# Patient Record
Sex: Female | Born: 1985 | Race: White | Hispanic: No | Marital: Married | Smoking: Never smoker
Health system: Southern US, Community
[De-identification: ages and names within clinical notes are randomized; demographics above are authoritative.]

## PROBLEM LIST (undated history)

## (undated) DIAGNOSIS — T7840XA Allergy, unspecified, initial encounter: Secondary | ICD-10-CM

## (undated) DIAGNOSIS — N94819 Vulvodynia, unspecified: Secondary | ICD-10-CM

## (undated) DIAGNOSIS — F32A Depression, unspecified: Secondary | ICD-10-CM

## (undated) DIAGNOSIS — R000 Tachycardia, unspecified: Secondary | ICD-10-CM

## (undated) DIAGNOSIS — E785 Hyperlipidemia, unspecified: Secondary | ICD-10-CM

## (undated) DIAGNOSIS — I10 Essential (primary) hypertension: Secondary | ICD-10-CM

## (undated) DIAGNOSIS — K219 Gastro-esophageal reflux disease without esophagitis: Secondary | ICD-10-CM

## (undated) DIAGNOSIS — G709 Myoneural disorder, unspecified: Secondary | ICD-10-CM

## (undated) DIAGNOSIS — F419 Anxiety disorder, unspecified: Secondary | ICD-10-CM

## (undated) DIAGNOSIS — N946 Dysmenorrhea, unspecified: Secondary | ICD-10-CM

## (undated) HISTORY — DX: Hyperlipidemia, unspecified: E78.5

## (undated) HISTORY — DX: Depression, unspecified: F32.A

## (undated) HISTORY — DX: Tachycardia, unspecified: R00.0

## (undated) HISTORY — DX: Anxiety disorder, unspecified: F41.9

## (undated) HISTORY — DX: Essential (primary) hypertension: I10

## (undated) HISTORY — DX: Dysmenorrhea, unspecified: N94.6

## (undated) HISTORY — DX: Myoneural disorder, unspecified: G70.9

## (undated) HISTORY — DX: Allergy, unspecified, initial encounter: T78.40XA

## (undated) HISTORY — PX: OTHER SURGICAL HISTORY: SHX169

## (undated) HISTORY — DX: Vulvodynia, unspecified: N94.819

## (undated) HISTORY — DX: Gastro-esophageal reflux disease without esophagitis: K21.9

---

## 2004-05-03 ENCOUNTER — Encounter: Admission: RE | Admit: 2004-05-03 | Discharge: 2004-05-03 | Payer: Self-pay | Admitting: Gastroenterology

## 2005-07-04 ENCOUNTER — Ambulatory Visit: Payer: Self-pay | Admitting: Family Medicine

## 2006-10-31 ENCOUNTER — Ambulatory Visit: Payer: Self-pay | Admitting: Family Medicine

## 2007-01-26 ENCOUNTER — Encounter: Admission: RE | Admit: 2007-01-26 | Discharge: 2007-01-26 | Payer: Self-pay | Admitting: Sports Medicine

## 2009-03-13 ENCOUNTER — Ambulatory Visit: Payer: Self-pay | Admitting: Internal Medicine

## 2010-02-16 ENCOUNTER — Other Ambulatory Visit: Payer: Self-pay | Admitting: General Practice

## 2010-04-19 ENCOUNTER — Ambulatory Visit: Payer: Self-pay | Admitting: General Practice

## 2010-08-30 ENCOUNTER — Other Ambulatory Visit: Payer: Self-pay | Admitting: Gastroenterology

## 2010-08-30 ENCOUNTER — Ambulatory Visit (HOSPITAL_COMMUNITY)
Admission: RE | Admit: 2010-08-30 | Discharge: 2010-08-30 | Disposition: A | Discharge status: Home / Self Care | Payer: PRIVATE HEALTH INSURANCE | Source: Ambulatory Visit | Attending: Gastroenterology | Admitting: Gastroenterology

## 2010-08-30 DIAGNOSIS — K219 Gastro-esophageal reflux disease without esophagitis: Secondary | ICD-10-CM | POA: Insufficient documentation

## 2010-08-30 DIAGNOSIS — K2940 Chronic atrophic gastritis without bleeding: Secondary | ICD-10-CM | POA: Insufficient documentation

## 2010-08-30 DIAGNOSIS — R1011 Right upper quadrant pain: Secondary | ICD-10-CM | POA: Insufficient documentation

## 2010-08-30 DIAGNOSIS — R1013 Epigastric pain: Secondary | ICD-10-CM | POA: Insufficient documentation

## 2010-08-30 DIAGNOSIS — R6881 Early satiety: Secondary | ICD-10-CM | POA: Insufficient documentation

## 2010-08-30 DIAGNOSIS — Z79899 Other long term (current) drug therapy: Secondary | ICD-10-CM | POA: Insufficient documentation

## 2010-08-30 DIAGNOSIS — R110 Nausea: Secondary | ICD-10-CM | POA: Insufficient documentation

## 2010-09-06 NOTE — Op Note (Signed)
  [name], [name] NO.:  [id number]  MEDICAL RECORD NO.:  [id number]  LOCATION:  [facility]                         FACILITY:  [facility]  PHYSICIAN:  Danise Edge, M.D.   DATE OF BIRTH:  24-Sep-1985  DATE OF PROCEDURE:  08/30/2010 DATE OF DISCHARGE:                              OPERATIVE REPORT   PROCEDURE:  Diagnostic esophagogastroduodenoscopy.  HISTORY:  Ms. Carrie Kennedy is a 25 year old female born August 03, 1985. The patient is an Dentist at [employer].  The patient has chronic, intermittent epigastric and right upper quadrant abdominal discomfort associated with early satiety and nausea but no dysphagia or odynophagia.  She takes Cymbalta on a regular basis. She takes omeprazole as needed.  She does not take nonsteroidal anti- inflammatory medication.  To investigate similar symptoms which she had in 2006, the patient underwent a normal barium upper GI x-ray series and abdominal ultrasound.  The patient reports no weight loss.  Her appetite remains normal.  There is no history of peptic ulcer disease.  ENDOSCOPIST:  Danise Edge, M.D.  PREMEDICATIONS:  Fentanyl 100 mcg, Versed 7.5 mg.  DESCRIPTION OF PROCEDURE:  The patient was placed in the left lateral decubitus position.  The Pentax gastroscope was passed through the posterior hypopharynx into the proximal esophagus without difficulty. The hypopharynx, larynx and vocal cords appeared normal.  Esophagoscopy:  The proximal, mid and lower segments of the esophageal mucosa appeared normal.  The squamocolumnar junction was noted at approximately 37 cm from the incisor teeth.  There was no endoscopic evidence for the presence of erosive esophagitis or Barrett's esophagus.  Gastroscopy:  Retroflex view of the gastric cardia and fundus was normal.  The gastric body, antrum and pylorus appeared normal.  Duodenoscopy:  The duodenal bulb and descending duodenum  appeared normal.  Gastric biopsies.  Biopsies were performed from the gastric antrum, gastric body, and gastric cardia to rule out H. pylori gastritis.  ASSESSMENT:  Normal esophagogastroduodenoscopy.  Gastric biopsies to rule out H pylori gastritis pending.          ______________________________ Danise Edge, M.D.     MJ/MEDQ  D:  08/30/2010  T:  08/30/2010  Job:  [id number]  Electronically Signed by Danise Edge M.D. on 09/06/2010 04:14:27 PM

## 2010-10-01 ENCOUNTER — Other Ambulatory Visit: Payer: Self-pay | Admitting: Gastroenterology

## 2010-10-01 DIAGNOSIS — R1012 Left upper quadrant pain: Secondary | ICD-10-CM

## 2010-10-03 ENCOUNTER — Other Ambulatory Visit: Payer: PRIVATE HEALTH INSURANCE

## 2010-10-09 ENCOUNTER — Ambulatory Visit
Admission: RE | Admit: 2010-10-09 | Discharge: 2010-10-09 | Disposition: A | Discharge status: Home / Self Care | Payer: PRIVATE HEALTH INSURANCE | Source: Ambulatory Visit | Attending: Gastroenterology | Admitting: Gastroenterology

## 2010-10-09 DIAGNOSIS — R1012 Left upper quadrant pain: Secondary | ICD-10-CM

## 2011-05-07 IMAGING — CT CT CHEST W/ CM
1 series · 16 of 34 positions shown, 20 images · IV contrast (APPLIED)
Comparison: none

REASON FOR EXAM: STAT CR [id number] chest pain eval for PE
COMMENTS:

PROCEDURE:     CT  - CT CHEST WITH CONTRAST  - April 19, 2010 [DATE]
RESULT:     Comparison: None
TECHNIQUE: Multiple thin section axial images were obtained from the lung
apices to the upper abdomen following 77 ml Isovue 370 intravenous contrast,
according to the PE protocol. These images were also reviewed on a Siemens
multiplanar work station.

[Series 4: soft tissue · axial · 0.66mm/px · z∈[-666,-414]mm · 16 of 95 slices shown, 20 images]
[im 7/95  mediastinal]
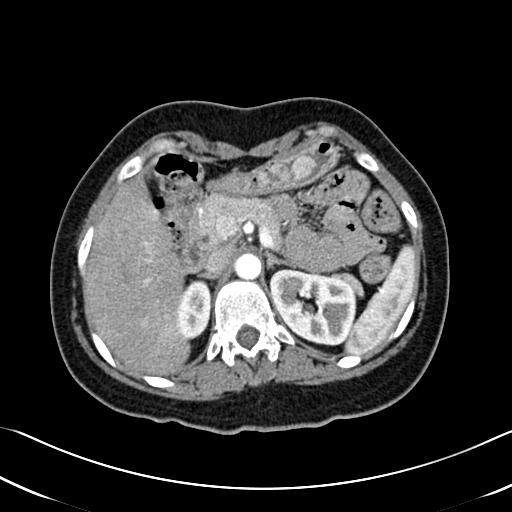
[im 7/95  lung]
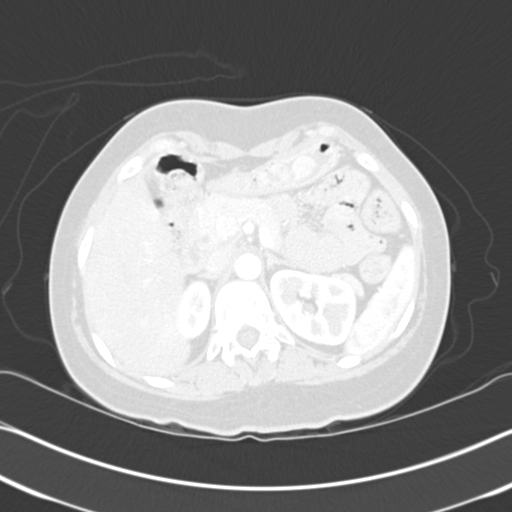
[im 14/95  lung]
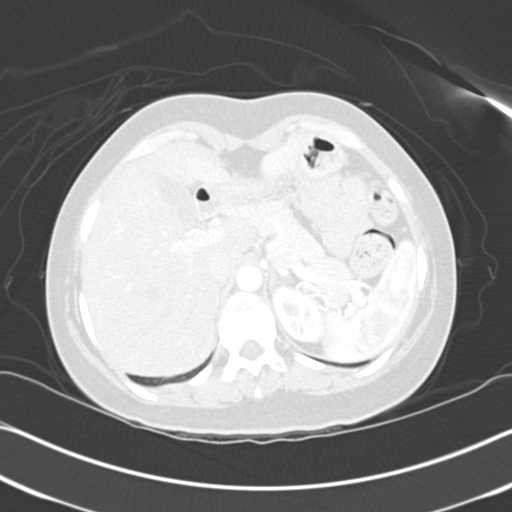
[im 19/95  lung]
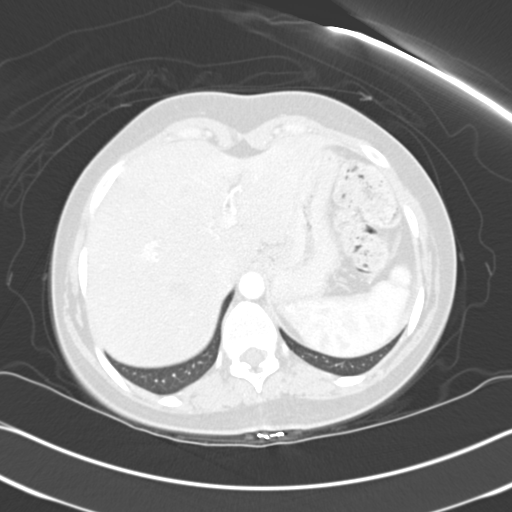
[im 25/95  lung]
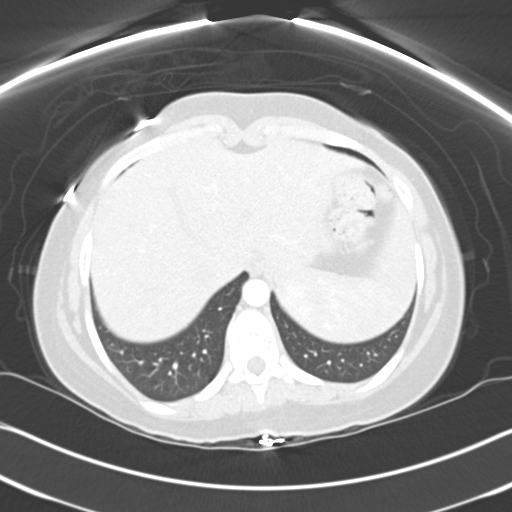
[im 32/95  mediastinal]
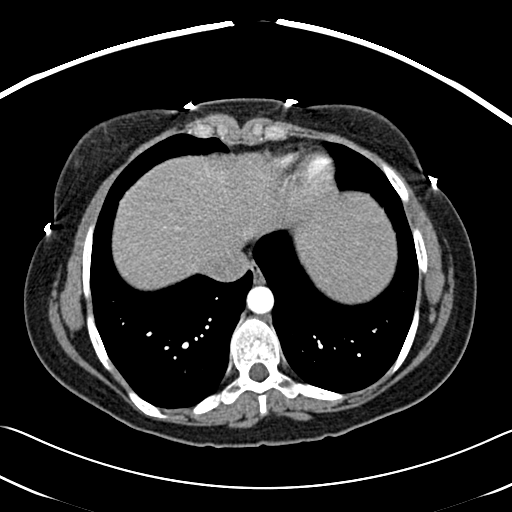
[im 32/95  lung]
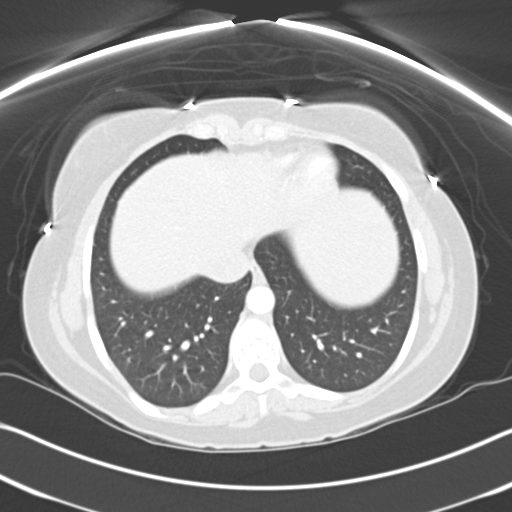
[im 38/95  lung]
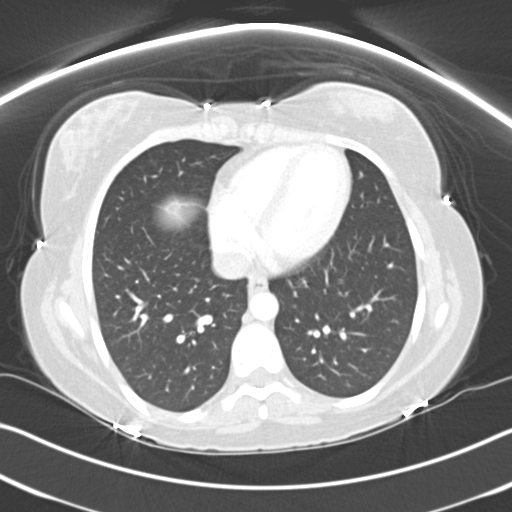
[im 42/95  lung]
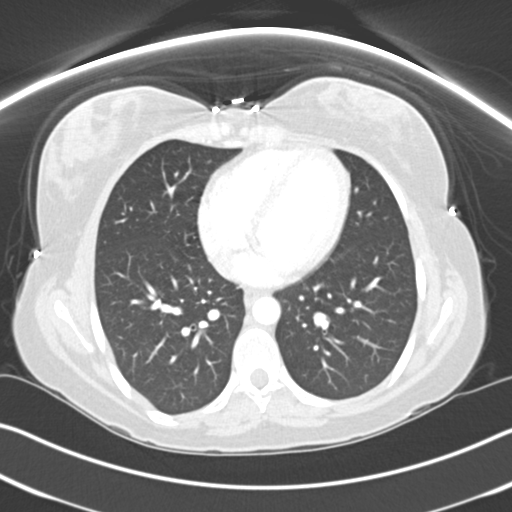
[im 46/95  lung]
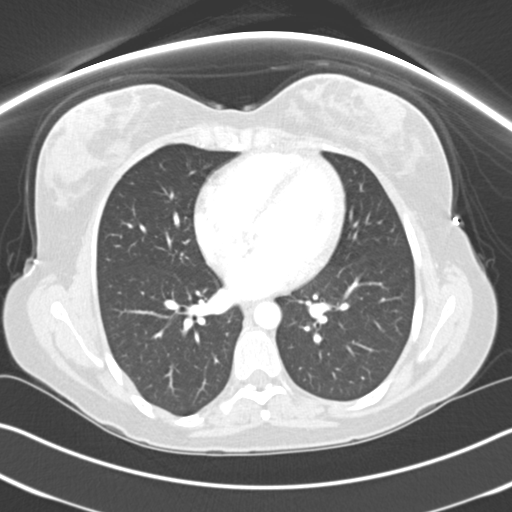
[im 51/95  mediastinal]
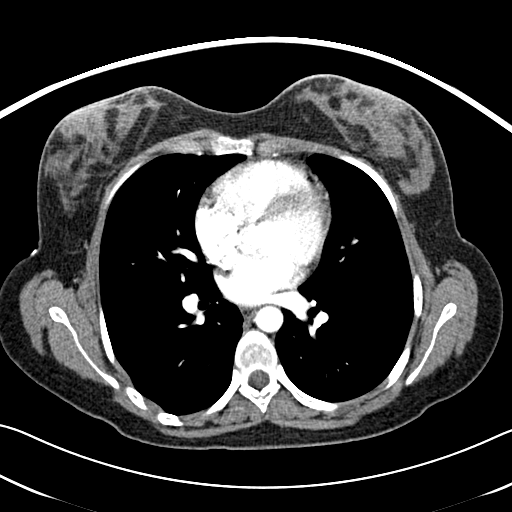
[im 51/95  lung]
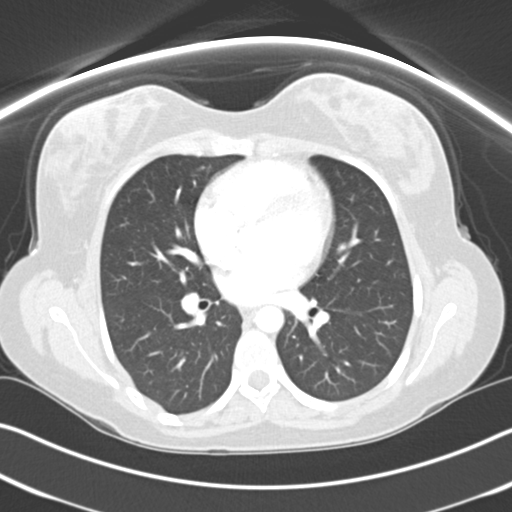
[im 56/95  lung]
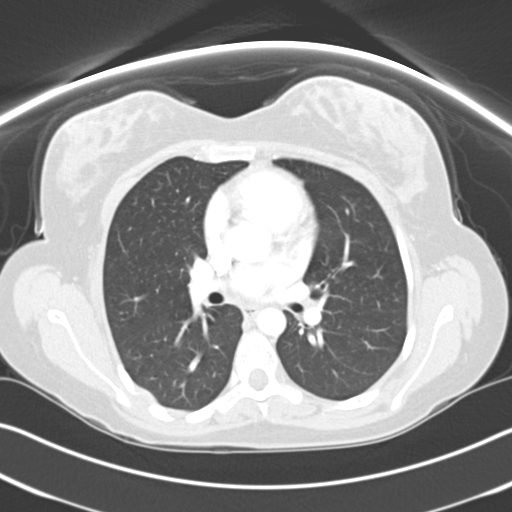
[im 60/95  lung]
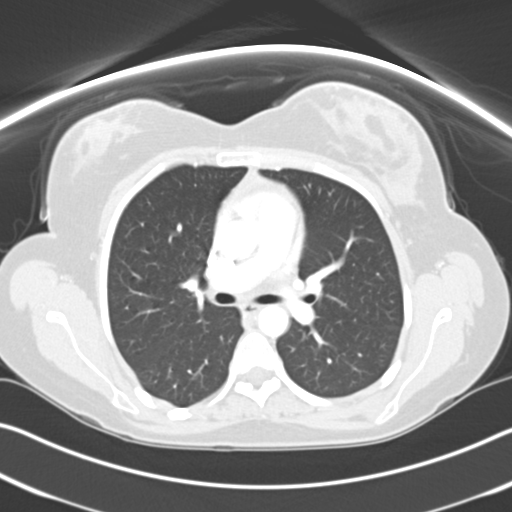
[im 67/95  lung]
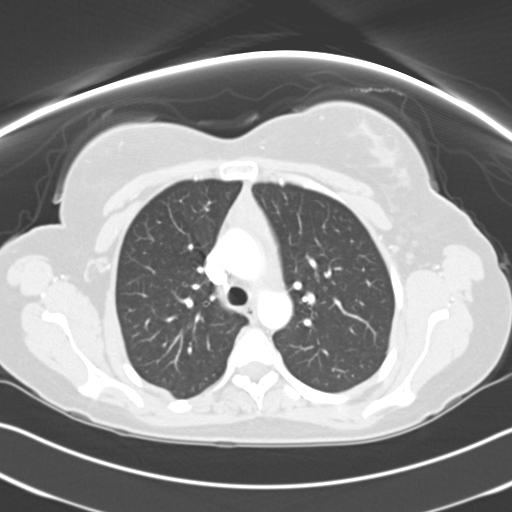
[im 74/95  mediastinal]
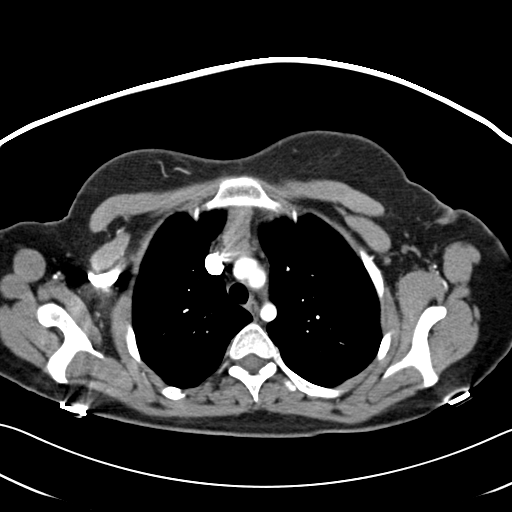
[im 74/95  lung]
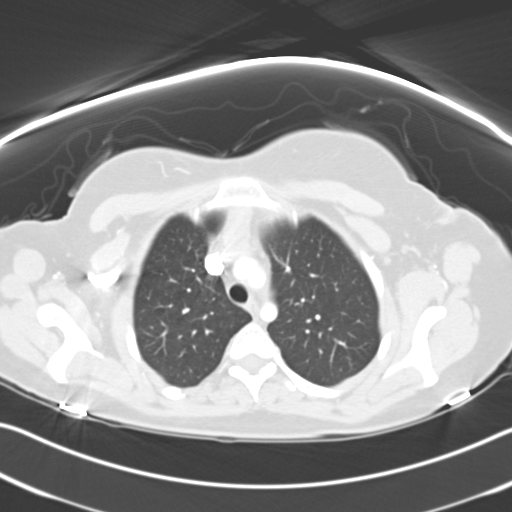
[im 77/95  lung]
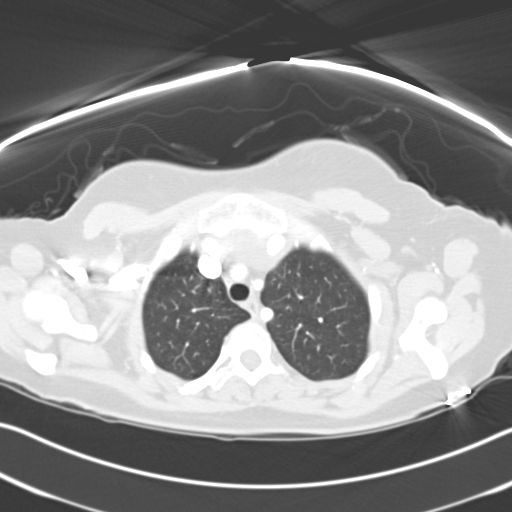
[im 84/95  lung]
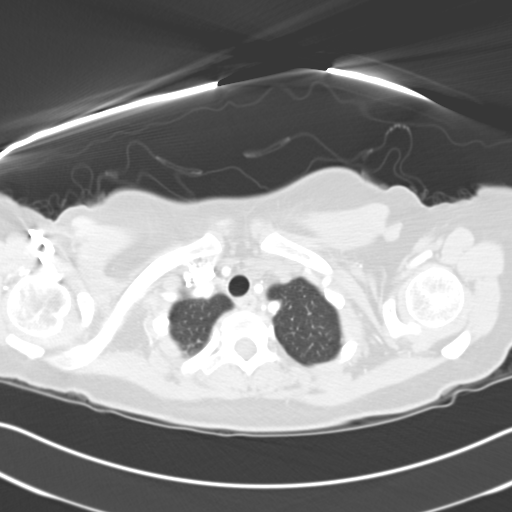
[im 91/95  lung]
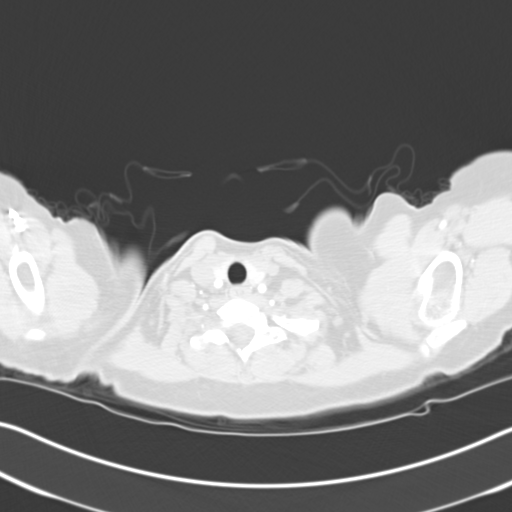

[16 of 34 positions shown; findings below may reference images not displayed]

FINDINGS: There is minimal residual thymus within the anterior mediastinum. No
mediastinal, hilar, or axillary lymphadenopathy.

The thoracic aorta is normal in caliber. No pulmonary embolus identified.

No focal pulmonary opacities identified. The osseous structures are
unremarkable.
IMPRESSION: No pulmonary embolus identified.

## 2011-09-11 ENCOUNTER — Encounter: Payer: Self-pay | Admitting: Family Medicine

## 2011-10-06 ENCOUNTER — Encounter: Payer: Self-pay | Admitting: Family Medicine

## 2011-10-27 IMAGING — US US ABDOMEN COMPLETE
1 series · 14 of 25 positions shown · non-contrast
Comparison: Ultrasound of the abdomen of 05/03/2004 with upper GI
from the same date.

CLINICAL DATA: Left upper quadrant abdominal pain

COMPLETE ABDOMINAL ULTRASOUND

[Series 1: us abdomen complete · 0.18mm/px · 14 of 79 slices shown]
[im 1/79]
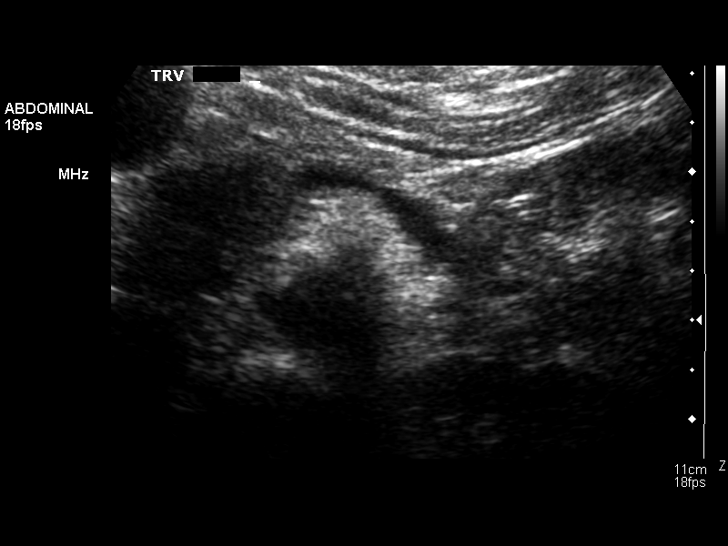
[im 7/79]
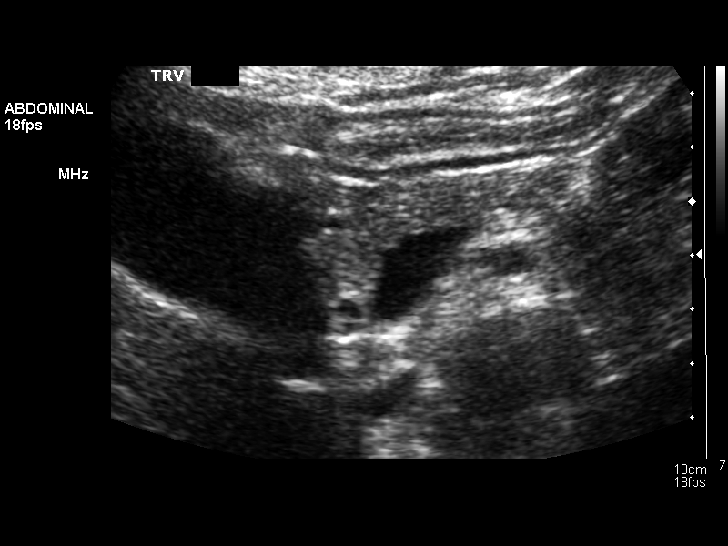
[im 14/79]
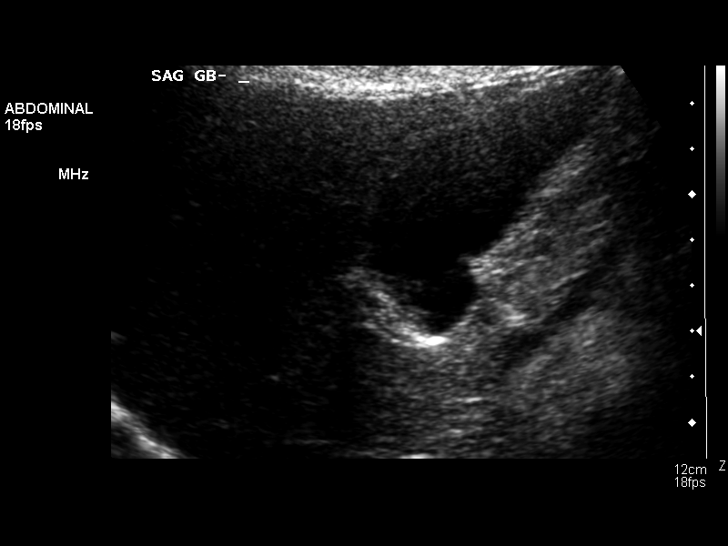
[im 20/79]
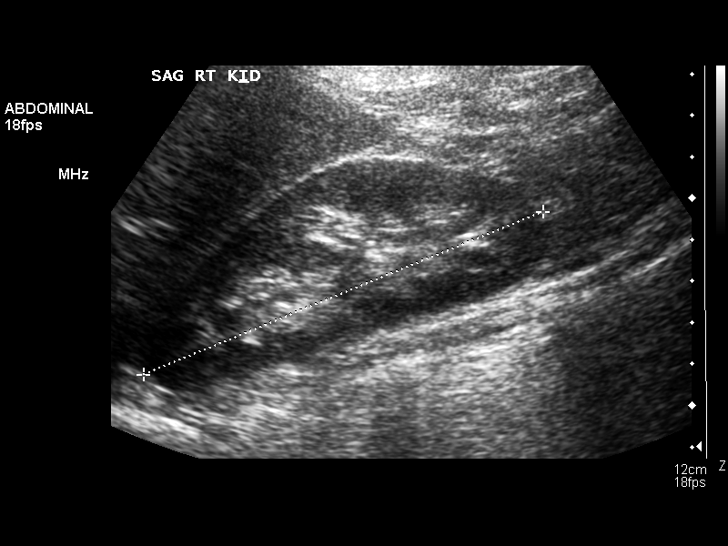
[im 27/79]
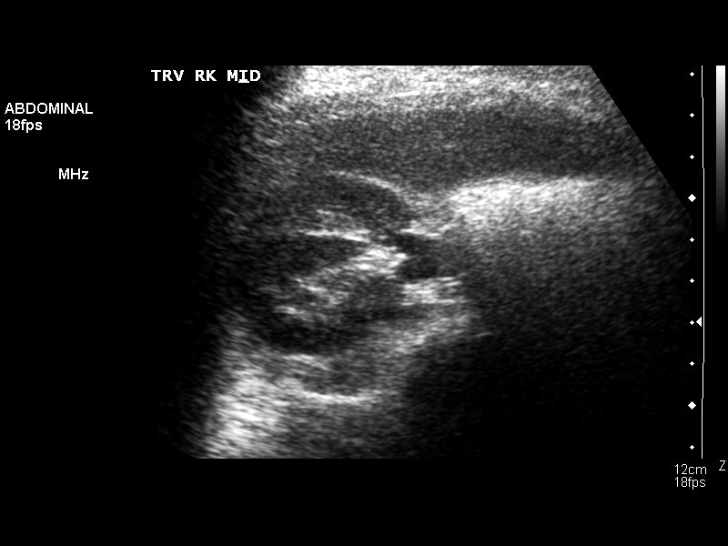
[im 30/79]
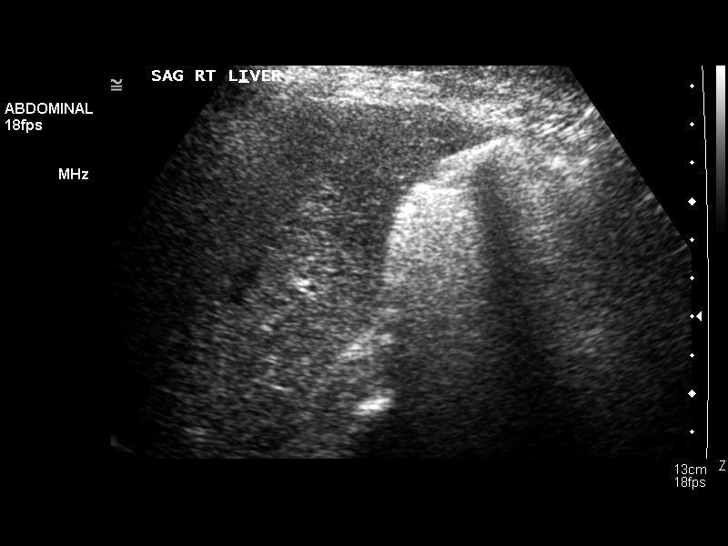
[im 36/79]
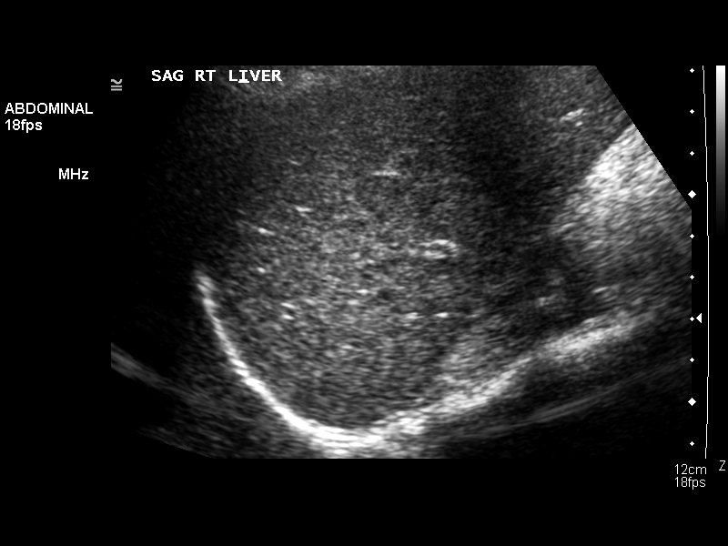
[im 43/79]
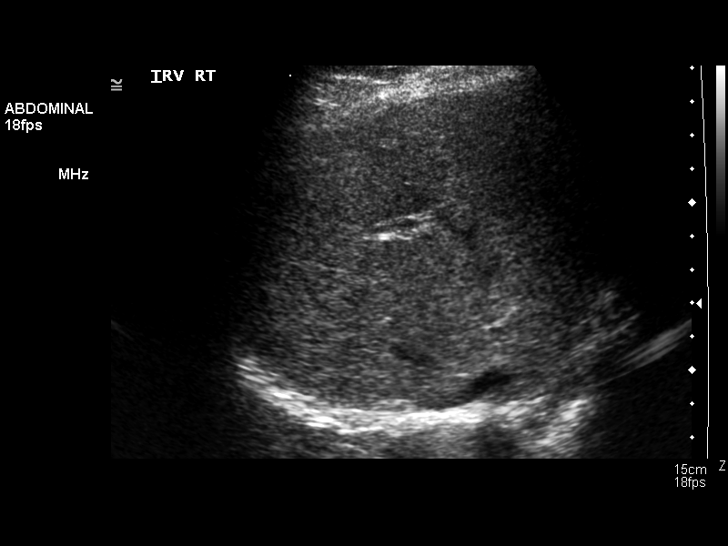
[im 49/79]
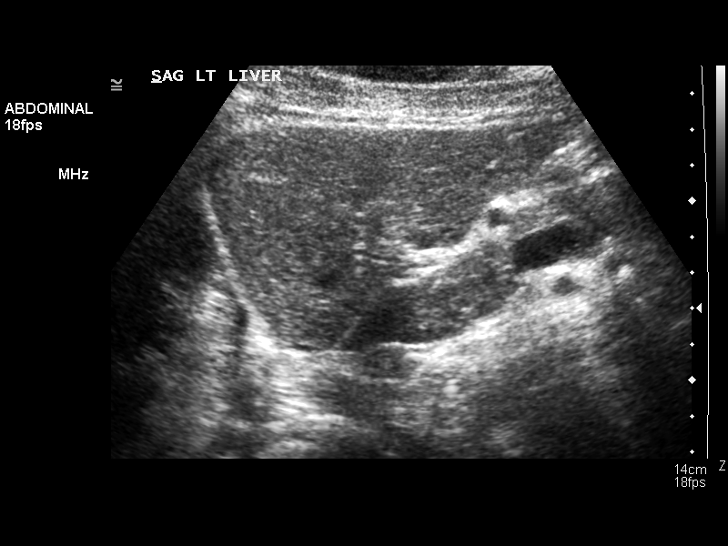
[im 53/79]
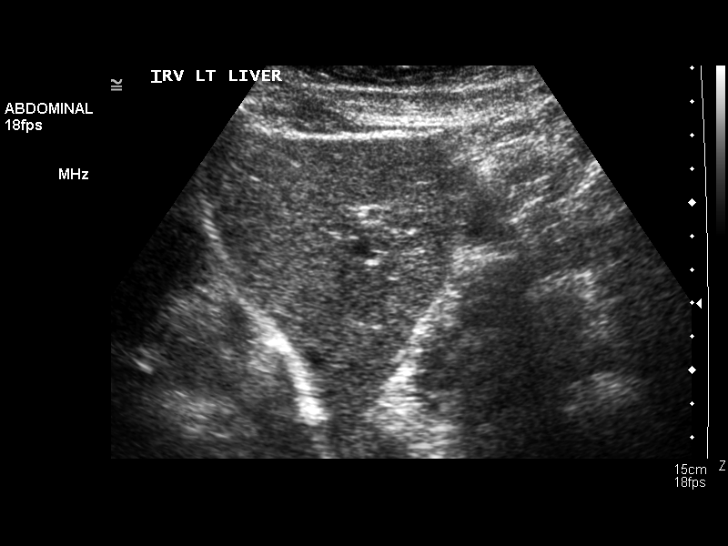
[im 59/79]
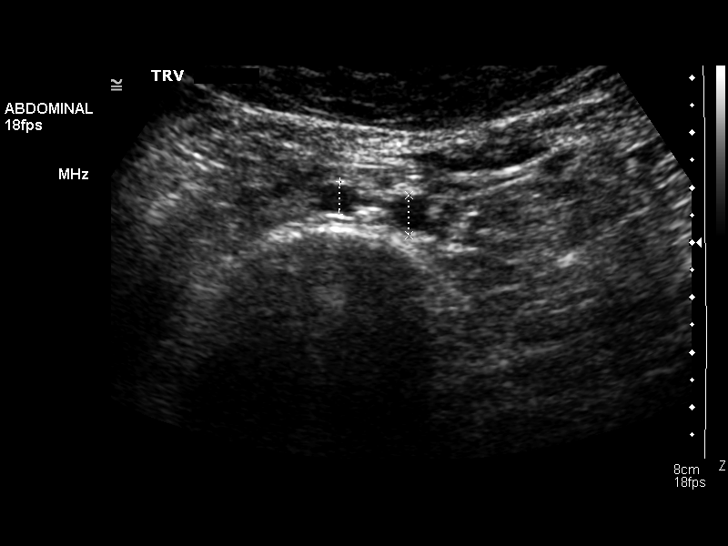
[im 66/79]
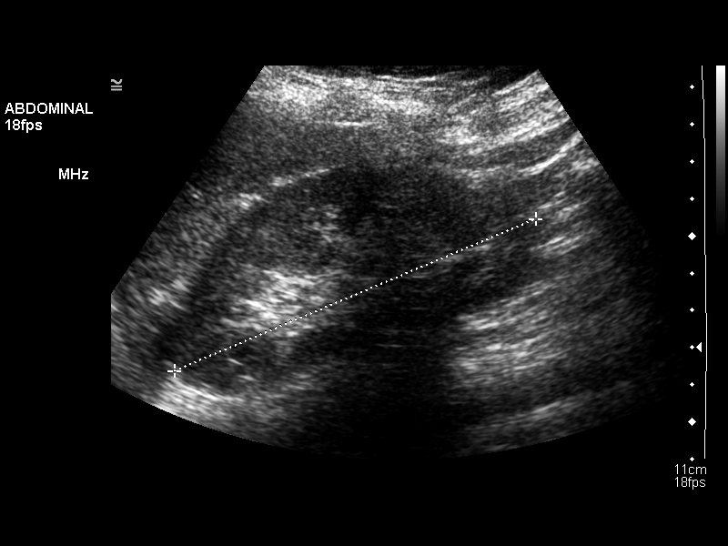
[im 72/79]
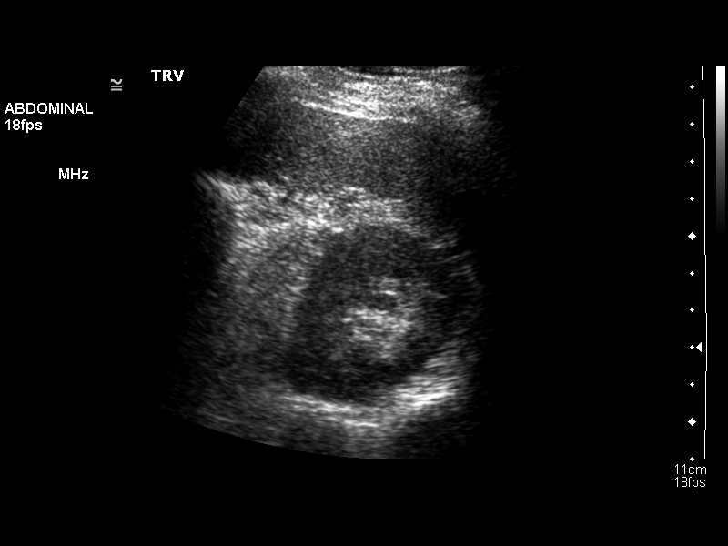
[im 79/79]
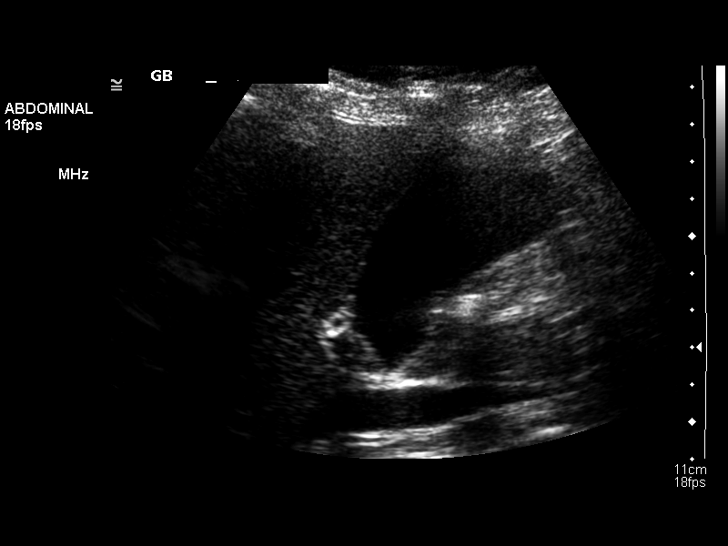

[14 of 25 positions shown; findings below may reference images not displayed]

FINDINGS: Gallbladder:  The gallbladder is visualized and no gallstones are
noted.  There is no pain over the gallbladder with compression.

Common bile duct:  The common bile duct is normal measuring 4.2 mm
in diameter distally.

Liver:  The liver has a normal echogenic pattern.  No ductal
dilatation is seen.

IVC:  The IVC is obscured by bowel gas.

Pancreas:  No focal abnormality seen.

Spleen:  The spleen is normal measuring 7.8 cm sagittally.

Right Kidney:  No hydronephrosis is noted.  The right kidney
measures 10.4 cm sagittally.

Left Kidney:  No hydronephrosis.  The left kidney measures 10.5 cm.

Abdominal aorta:  The abdominal aorta is normal in caliber.
IMPRESSION: Negative abdominal ultrasound.  No gallstones.  No hydronephrosis.

## 2011-11-05 ENCOUNTER — Encounter: Payer: Self-pay | Admitting: Family Medicine

## 2011-12-06 ENCOUNTER — Encounter: Payer: Self-pay | Admitting: Family Medicine

## 2013-10-29 ENCOUNTER — Other Ambulatory Visit: Payer: Self-pay

## 2015-09-22 DIAGNOSIS — N94818 Other vulvodynia: Secondary | ICD-10-CM | POA: Diagnosis not present

## 2015-09-22 DIAGNOSIS — Z6827 Body mass index (BMI) 27.0-27.9, adult: Secondary | ICD-10-CM | POA: Diagnosis not present

## 2015-09-22 DIAGNOSIS — R198 Other specified symptoms and signs involving the digestive system and abdomen: Secondary | ICD-10-CM | POA: Diagnosis not present

## 2015-11-17 DIAGNOSIS — J301 Allergic rhinitis due to pollen: Secondary | ICD-10-CM | POA: Diagnosis not present

## 2015-11-24 DIAGNOSIS — I10 Essential (primary) hypertension: Secondary | ICD-10-CM | POA: Diagnosis not present

## 2015-11-24 DIAGNOSIS — Z23 Encounter for immunization: Secondary | ICD-10-CM | POA: Diagnosis not present

## 2016-02-19 DIAGNOSIS — Z01419 Encounter for gynecological examination (general) (routine) without abnormal findings: Secondary | ICD-10-CM | POA: Diagnosis not present

## 2016-02-19 DIAGNOSIS — Z3009 Encounter for other general counseling and advice on contraception: Secondary | ICD-10-CM | POA: Diagnosis not present

## 2016-02-19 DIAGNOSIS — I10 Essential (primary) hypertension: Secondary | ICD-10-CM | POA: Diagnosis not present

## 2016-02-19 DIAGNOSIS — N94819 Vulvodynia, unspecified: Secondary | ICD-10-CM | POA: Diagnosis not present

## 2016-03-01 DIAGNOSIS — M25571 Pain in right ankle and joints of right foot: Secondary | ICD-10-CM | POA: Diagnosis not present

## 2016-03-01 DIAGNOSIS — M25572 Pain in left ankle and joints of left foot: Secondary | ICD-10-CM | POA: Diagnosis not present

## 2016-03-07 DIAGNOSIS — M79671 Pain in right foot: Secondary | ICD-10-CM | POA: Diagnosis not present

## 2016-03-13 DIAGNOSIS — M79671 Pain in right foot: Secondary | ICD-10-CM | POA: Diagnosis not present

## 2016-03-14 ENCOUNTER — Other Ambulatory Visit: Payer: Self-pay | Admitting: *Deleted

## 2016-03-14 DIAGNOSIS — G5751 Tarsal tunnel syndrome, right lower limb: Secondary | ICD-10-CM

## 2016-03-19 ENCOUNTER — Ambulatory Visit (INDEPENDENT_AMBULATORY_CARE_PROVIDER_SITE_OTHER): Payer: BLUE CROSS/BLUE SHIELD | Admitting: Neurology

## 2016-03-19 DIAGNOSIS — M79671 Pain in right foot: Secondary | ICD-10-CM | POA: Diagnosis not present

## 2016-03-19 NOTE — Procedures (Signed)
[facility]  [address], [address]  [place], [place] [address] Tel: [phone] Fax:  [phone] Test Date:  03/19/2016  Patient: Carrie Kennedy DOB: 15-Jan-1986 Physician: Nita Sickle, DO  Sex: Female Height: 5\' 3"  Ref Phys: Pati Gallo, M.D.  ID#: [id number] Temp: 34.3 Technician: M. Dean   Patient Complaints: This is a 31 year old female referred for right foot and low back pain and to evaluate for tarsal tunnel syndrome.  NCV & EMG Findings: Extensive electrodiagnostic testing of the right lower extremity and additional studies of the left shows:  1. Right sural and superficial peroneal sensory responses are within normal limits. Bilateral median and lateral plantar responses are symmetric and within normal limits. 2. Right peroneal and tibial motor responses within normal limits. 3. Right tibial H reflex studies within normal limits. 4. There is no evidence of active or chronic motor axon loss changes affecting any of the tested muscles. Motor unit configuration and recruitment pattern is essentially within normal limits.  Impression: This is a normal study of the right lower extremity.   In particular, there is no evidence of tarsal tunnel syndrome, lumbosacral radiculopathy, or a large fiber sensorimotor polyneuropathy affecting the right leg.   ___________________________ Nita Sickle, DO    Nerve Conduction Studies Anti Sensory Summary Table   Site NR Peak (ms) Norm Peak (ms) P-T Amp (V) Norm P-T Amp  Right Sup Peroneal Anti Sensory (Ant Lat Mall)  34.3C  12 cm    2.9 <4.5 18.1 >5  Right Sural Anti Sensory (Lat Mall)  34.3C  Calf    2.9 <4.5 27.8 >5   Motor Summary Table   Site NR Onset (ms) Norm Onset (ms) O-P Amp (mV) Norm O-P Amp Site1 Site2 Delta-0 (ms) Dist (cm) Vel (m/s) Norm Vel (m/s)  Right Peroneal Motor (Ext Dig Brev)  34.3C  Ankle    2.5 <5.5 4.2 >3 B Fib Ankle 5.9 30.0 51 >40  B Fib    8.4  3.6  Poplt B Fib 1.6 10.0 63 >40    Poplt    10.0  3.5         Right Tibial Motor (Abd Hall Brev)  34.3C  Ankle    2.6 <6.0 19.7 >8 Knee Ankle 7.5 37.0 49 >40  Knee    10.1  12.2          Mixed Summary Table   Site NR Peak (ms) Norm Peak (ms) P-T Amp (V) Norm P-T Amp  Left Lateral Plantar Mixed (Med Malleolus)  34.3C  Lateral Foot    2.7 <3.7 15.4 >8  Right Lateral Plantar Mixed (Med Malleolus)  34.3C  Lateral Foot    2.6 <3.7 10.2 >8  Left Medial Plantar Mixed (Med Malleolus)  34.3C  Medial Foot    2.6 <3.7 15.7 >8  Right Medial Plantar Mixed (Med Malleolus)  34.3C  Medial Foot    2.0 <3.7 15.4 >8   H Reflex Studies   NR H-Lat (ms) Lat Norm (ms) L-R H-Lat (ms)  Right Tibial (Gastroc)  34.3C     30.07 <35    EMG   Side Muscle Ins Act Fibs Psw Fasc Number Recrt Dur Dur. Amp Amp. Poly Poly. Comment  Right AntTibialis Nml Nml Nml Nml Nml Nml Nml Nml Nml Nml Nml Nml N/A  Right Gastroc Nml Nml Nml Nml Nml Nml Nml Nml Nml Nml Nml Nml N/A  Right Flex Dig Long Nml Nml Nml Nml Nml Nml Nml Nml Nml Nml Nml  Nml N/A  Right AbdHallucis Nml Nml Nml Nml 1- Mod-V Nml Nml Nml Nml Nml Nml N/A  Right RectFemoris Nml Nml Nml Nml Nml Nml Nml Nml Nml Nml Nml Nml N/A  Right GluteusMed Nml Nml Nml Nml Nml Nml Nml Nml Nml Nml Nml Nml N/A  Right Lumbo Parasp Low Nml Nml Nml Nml Nml Nml Nml Nml Nml Nml Nml Nml N/A  Right AbdDigQuinti Nml Nml Nml Nml 1- Mod-V Nml Nml Nml Nml Nml Nml N/A  Right BicepsFemS Nml Nml Nml Nml Nml Nml Nml Nml Nml Nml Nml Nml N/A      Waveforms:

## 2016-08-26 ENCOUNTER — Encounter: Payer: Self-pay | Admitting: Obstetrics and Gynecology

## 2016-08-26 ENCOUNTER — Ambulatory Visit (INDEPENDENT_AMBULATORY_CARE_PROVIDER_SITE_OTHER): Payer: BLUE CROSS/BLUE SHIELD | Admitting: Obstetrics and Gynecology

## 2016-08-26 VITALS — BP 130/80 | HR 88 | Ht 64.0 in | Wt 175.0 lb

## 2016-08-26 DIAGNOSIS — R000 Tachycardia, unspecified: Secondary | ICD-10-CM

## 2016-08-26 DIAGNOSIS — R5382 Chronic fatigue, unspecified: Secondary | ICD-10-CM

## 2016-08-26 DIAGNOSIS — I10 Essential (primary) hypertension: Secondary | ICD-10-CM

## 2016-08-26 DIAGNOSIS — N94819 Vulvodynia, unspecified: Secondary | ICD-10-CM

## 2016-08-26 MED ORDER — PROPRANOLOL HCL 10 MG PO TABS: ORAL_TABLET | ORAL | 5 refills | Status: DC

## 2016-08-26 NOTE — Progress Notes (Signed)
Chief Complaint  Patient presents with  . Blood Pressure Check    HPI:      Ms. Carrie Kennedy is a 31 y.o. G0P0000 who LMP was Patient's last menstrual period was 08/24/2016., presents today for  BP and pulse f/u. She was placed on propranolol by Claris Gower MD for hypertension and tachycardia, but moved back here and is having [place] manage it. Pt's sx controlled with meds. Pt is supposed to take propranolol 10 mg BID but takes 2 tabs QD (20 mg) because she can't remember BID dosing. She thinks her pulse increases some during the day when she is stressed. Her BP has been controlled.   She has a hx of vulvodynia, followed by Dr. Orion Crook in the past at [facility]. Pt is on cymbalta with sx relief of sx when sex active in past, but is not sex active now. Pt complains of fatigue for yrs and wonder is it's related to cymbalta or propranolol. She is exercising and that helps some.     Patient Active Problem List   Diagnosis Date Noted  . Tachycardia   . Dysmenorrhea   . Vulvodynia   . Hypertension 08/26/2016    Family History  Problem Relation Age of Onset  . Hypertension Mother   . Heart disease Mother   . Prostate cancer Father   . Hypertension Father     Social History   Social History  . Marital status: Single    Spouse name: N/A  . Number of children: N/A  . Years of education: N/A   Occupational History  . Not on file.   Social History Main Topics  . Smoking status: Never Smoker  . Smokeless tobacco: Never Used  . Alcohol use Yes  . Drug use: No  . Sexual activity: No   Other Topics Concern  . Not on file   Social History Narrative  . No narrative on file     Current Outpatient Prescriptions:  .  DULoxetine (CYMBALTA) 20 MG capsule, Take 40 mg by mouth., Disp: , Rfl:  .  fluticasone (FLONASE) 50 MCG/ACT nasal spray, , Disp: , Rfl:  .  azithromycin (ZITHROMAX) 250 MG tablet, azithromycin 250 mg tablet  TAKE 2 TABLETS BY MOUTH TODAY, THEN TAKE 1 TABLET DAILY FOR 4  DAYS, Disp: , Rfl:  .  chlorpheniramine-HYDROcodone (TUSSIONEX) 10-8 MG/5ML SUER, hydrocodone 10 mg-chlorpheniramine 8 mg/5 mL oral susp extend.rel 12hr  TAKE 2.5-5ML EVERY 12 HOURS BY MOUTH AS NEEDED FOR 7 DAYS, Disp: , Rfl:  .  hydrOXYzine (ATARAX/VISTARIL) 25 MG tablet, hydroxyzine HCl 25 mg tablet, Disp: , Rfl:  .  propranolol (INDERAL) 10 MG tablet, TAKE 1 TABLET BY MOUTH 2 TIMES A DAY, Disp: 60 tablet, Rfl: 5  Review of Systems  Constitutional: Positive for fatigue. Negative for fever.  Gastrointestinal: Negative for blood in stool, constipation, diarrhea, nausea and vomiting.  Genitourinary: Negative for dyspareunia, dysuria, flank pain, frequency, hematuria, urgency, vaginal bleeding, vaginal discharge and vaginal pain.  Musculoskeletal: Negative for back pain.  Skin: Negative for rash.     OBJECTIVE:   Vitals:  BP 130/80   Pulse 88   Ht 5\' 4"  (1.626 m)   Wt 175 lb (79.4 kg)   LMP 08/24/2016   BMI 30.04 kg/m   Physical Exam  Constitutional: She is oriented to person, place, and time and well-developed, well-nourished, and in no distress.  Pulmonary/Chest: Effort normal.  Neurological: She is alert and oriented to person, place, and time.  Psychiatric: Mood,  memory, affect and judgment normal.  Vitals reviewed.    Assessment/Plan: Essential hypertension - Controlled with propranolol. Rx RF. F/u again at 1/19 annual. - Plan: propranolol (INDERAL) 10 MG tablet  Tachycardia - Improved with propranolol but recommend BID dosing instead of QHS dosing. Pt will try to remember to take it BID. Rx RF.  Vulvodynia - Pt on cymbalta. No sx if not sex active. Can try decreasing dose to see if helps with fatigue. F/u prn.  Chronic fatigue - Question related to meds. Try to decrease cymbalta. F/u prn.    Return in about 6 months (around 02/26/2017) for annual.  Daegon Deiss B. Amye Grego, PA-C 08/27/2016 10:58 AM

## 2016-08-27 ENCOUNTER — Encounter: Payer: Self-pay | Admitting: Obstetrics and Gynecology

## 2016-08-27 DIAGNOSIS — N946 Dysmenorrhea, unspecified: Secondary | ICD-10-CM | POA: Insufficient documentation

## 2016-08-27 DIAGNOSIS — R000 Tachycardia, unspecified: Secondary | ICD-10-CM | POA: Insufficient documentation

## 2016-08-27 DIAGNOSIS — N94819 Vulvodynia, unspecified: Secondary | ICD-10-CM | POA: Insufficient documentation

## 2017-02-21 DIAGNOSIS — H73892 Other specified disorders of tympanic membrane, left ear: Secondary | ICD-10-CM | POA: Diagnosis not present

## 2017-02-21 DIAGNOSIS — H6123 Impacted cerumen, bilateral: Secondary | ICD-10-CM | POA: Diagnosis not present

## 2017-03-28 DIAGNOSIS — Z6829 Body mass index (BMI) 29.0-29.9, adult: Secondary | ICD-10-CM | POA: Diagnosis not present

## 2017-03-28 DIAGNOSIS — Z0000 Encounter for general adult medical examination without abnormal findings: Secondary | ICD-10-CM | POA: Diagnosis not present

## 2017-03-28 DIAGNOSIS — R5382 Chronic fatigue, unspecified: Secondary | ICD-10-CM | POA: Diagnosis not present

## 2017-05-16 ENCOUNTER — Telehealth: Payer: Self-pay

## 2017-05-16 ENCOUNTER — Other Ambulatory Visit: Payer: Self-pay | Admitting: Obstetrics and Gynecology

## 2017-05-16 MED ORDER — DULOXETINE HCL 20 MG PO CPEP: 20.0000 mg | ORAL_CAPSULE | Freq: Every day | ORAL | 0 refills | Status: DC

## 2017-05-16 NOTE — Telephone Encounter (Signed)
Pt past due for annual. Needs to sched appt. Rx cymbalta RF for 1 mo.

## 2017-05-16 NOTE — Telephone Encounter (Signed)
Pt is scheduled for her annual 06/05/17. Thank you!

## 2017-05-16 NOTE — Telephone Encounter (Signed)
Pt called today wanting a refill on her Rx Cymbalta 20mg , pt states she tried to get the pharmacy to refill but they told her it was not time for her to get a refill. Pt states she has about 5 or 6 days left of the medication but wanted to let us know before she became completely out.

## 2017-06-05 ENCOUNTER — Ambulatory Visit (INDEPENDENT_AMBULATORY_CARE_PROVIDER_SITE_OTHER): Payer: BLUE CROSS/BLUE SHIELD | Admitting: Obstetrics and Gynecology

## 2017-06-05 ENCOUNTER — Encounter: Payer: Self-pay | Admitting: Obstetrics and Gynecology

## 2017-06-05 VITALS — BP 136/90 | HR 90 | Ht 64.0 in | Wt 168.0 lb

## 2017-06-05 DIAGNOSIS — Z1151 Encounter for screening for human papillomavirus (HPV): Secondary | ICD-10-CM

## 2017-06-05 DIAGNOSIS — I10 Essential (primary) hypertension: Secondary | ICD-10-CM

## 2017-06-05 DIAGNOSIS — Z01419 Encounter for gynecological examination (general) (routine) without abnormal findings: Secondary | ICD-10-CM

## 2017-06-05 DIAGNOSIS — Z124 Encounter for screening for malignant neoplasm of cervix: Secondary | ICD-10-CM

## 2017-06-05 DIAGNOSIS — N94819 Vulvodynia, unspecified: Secondary | ICD-10-CM

## 2017-06-05 MED ORDER — DULOXETINE HCL 20 MG PO CPEP: 20.0000 mg | ORAL_CAPSULE | Freq: Every day | ORAL | 1 refills | Status: DC

## 2017-06-05 MED ORDER — PROPRANOLOL HCL 10 MG PO TABS: ORAL_TABLET | ORAL | 1 refills | Status: DC

## 2017-06-05 NOTE — Patient Instructions (Signed)
I value your feedback and entrusting us with your care. If you get a [facility] patient survey, I would appreciate you taking the time to let us know about your experience today. Thank you! 

## 2017-06-05 NOTE — Progress Notes (Signed)
PCP:  Lorie Phenix, MD   Chief Complaint  Patient presents with  . Gynecologic Exam     HPI:      Ms. Carrie Kennedy is a 32 y.o. G0P0000 who LMP was Patient's last menstrual period was 05/09/2017 (exact date)., presents today for her annual examination.  Her menses are regular every 28-30 days, lasting 5 days.  Dysmenorrhea mild, occurring first 1-2 days of flow. She does not have intermenstrual bleeding. Yaz OCPs stopped last yr due to HTN.   Sex activity: not sexually active.  Last Pap: November 17, 2014  Results were: no abnormalities /neg HPV DNA  Hx of STDs: none  There is a FH of breast cancer in her MGM, genetic testing not indicated. There is a FH of ovarian cancer in her mat 2nd cousin. The patient does do self-breast exams.  Tobacco use: The patient denies current or previous tobacco use. Alcohol use: social drinker No drug use.  Exercise: moderately active  She does get adequate calcium and Vitamin D in her diet. Seeing PCP for fatigue and was found to be anemic/Vit D deficient. Taking supp and starting to feel better.   Pt also on propranolol 10 mg BID (only takes QD) for HTN and tachycardia, started by Dr. Orion Crook for vulvodynia (no HTN at the time) but also continued by MD in [place]. Pt moved back here and we have been managing it. Pt also with long hx of vulvodynia, originally seen by Dr. Orion Crook at [facility] in the past. Started on cymbalta 20 mg daily for sx, and sx controlled if sex active. Not currently active but also likes cymbalta for anxiety sx.    Past Medical History:  Diagnosis Date  . Dysmenorrhea   . Hyperlipidemia   . Hypertension   . Tachycardia   . Vulvodynia     Past Surgical History:  Procedure Laterality Date  . EAR      TUBES    Family History  Problem Relation Age of Onset  . Hypertension Mother   . Heart disease Mother   . Prostate cancer Father   . Hypertension Father     Social History   Socioeconomic History  . Marital  status: Single    Spouse name: Not on file  . Number of children: Not on file  . Years of education: Not on file  . Highest education level: Not on file  Occupational History  . Not on file  Social Needs  . Financial resource strain: Not on file  . Food insecurity:    Worry: Not on file    Inability: Not on file  . Transportation needs:    Medical: Not on file    Non-medical: Not on file  Tobacco Use  . Smoking status: Never Smoker  . Smokeless tobacco: Never Used  Substance and Sexual Activity  . Alcohol use: Yes  . Drug use: No  . Sexual activity: Not Currently    Birth control/protection: None  Lifestyle  . Physical activity:    Days per week: Not on file    Minutes per session: Not on file  . Stress: Not on file  Relationships  . Social connections:    Talks on phone: Not on file    Gets together: Not on file    Attends religious service: Not on file    Active member of club or organization: Not on file    Attends meetings of clubs or organizations: Not on file    Relationship  status: Not on file  . Intimate partner violence:    Fear of current or ex partner: Not on file    Emotionally abused: Not on file    Physically abused: Not on file    Forced sexual activity: Not on file  Other Topics Concern  . Not on file  Social History Narrative  . Not on file    Outpatient Medications Prior to Visit  Medication Sig Dispense Refill  . Azelastine-Fluticasone (DYMISTA) 137-50 MCG/ACT SUSP 1 spray by Each Nare route Two (2) times a day.    . cholecalciferol (VITAMIN D) 1000 units tablet Take 1,000 Units by mouth daily.    . ferrous sulfate 325 (65 FE) MG tablet Take 325 mg by mouth daily with breakfast.    . magnesium oxide (MAG-OX) 400 MG tablet Take by mouth.    . vitamin B-12 (CYANOCOBALAMIN) 1000 MCG tablet Take 1,000 mcg by mouth daily.    . DULoxetine (CYMBALTA) 20 MG capsule Take 1 capsule (20 mg total) by mouth daily. 30 capsule 0  . propranolol (INDERAL) 10  MG tablet TAKE 1 TABLET BY MOUTH 2 TIMES A DAY 60 tablet 5  . azithromycin (ZITHROMAX) 250 MG tablet azithromycin 250 mg tablet  TAKE 2 TABLETS BY MOUTH TODAY, THEN TAKE 1 TABLET DAILY FOR 4 DAYS    . chlorpheniramine-HYDROcodone (TUSSIONEX) 10-8 MG/5ML SUER hydrocodone 10 mg-chlorpheniramine 8 mg/5 mL oral susp extend.rel 12hr  TAKE 2.5-5ML EVERY 12 HOURS BY MOUTH AS NEEDED FOR 7 DAYS    . fluticasone (FLONASE) 50 MCG/ACT nasal spray     . hydrOXYzine (ATARAX/VISTARIL) 25 MG tablet hydroxyzine HCl 25 mg tablet     No facility-administered medications prior to visit.       ROS:  Review of Systems  Constitutional: Positive for fatigue. Negative for fever and unexpected weight change.  Respiratory: Negative for cough, shortness of breath and wheezing.   Cardiovascular: Negative for chest pain, palpitations and leg swelling.  Gastrointestinal: Negative for blood in stool, constipation, diarrhea, nausea and vomiting.  Endocrine: Negative for cold intolerance, heat intolerance and polyuria.  Genitourinary: Negative for dyspareunia, dysuria, flank pain, frequency, genital sores, hematuria, menstrual problem, pelvic pain, urgency, vaginal bleeding, vaginal discharge and vaginal pain.  Musculoskeletal: Negative for back pain, joint swelling and myalgias.  Skin: Negative for rash.  Neurological: Negative for dizziness, syncope, light-headedness, numbness and headaches.  Hematological: Negative for adenopathy.  Psychiatric/Behavioral: Negative for agitation, confusion, sleep disturbance and suicidal ideas. The patient is not nervous/anxious.    BREAST: No symptoms   Objective: BP 136/90   Pulse 90   Ht  (1.626 m)   Wt 168 lb (76.2 kg)   LMP 05/09/2017 (Exact Date)   BMI 28.84 kg/m    Physical Exam  Constitutional: She is oriented to person, place, and time. She appears well-developed and well-nourished.  Genitourinary: Vagina normal and uterus normal. There is no rash or  tenderness on the right labia. There is no rash or tenderness on the left labia. No erythema or tenderness in the vagina. No vaginal discharge found. Right adnexum does not display mass and does not display tenderness. Left adnexum does not display mass and does not display tenderness. Cervix does not exhibit motion tenderness or polyp. Uterus is not enlarged or tender.  Neck: Normal range of motion. No thyromegaly present.  Cardiovascular: Normal rate, regular rhythm and normal heart sounds.  No murmur heard. Pulmonary/Chest: Effort normal and breath sounds normal. Right breast exhibits no mass, no  nipple discharge, no skin change and no tenderness. Left breast exhibits no mass, no nipple discharge, no skin change and no tenderness.  Abdominal: Soft. There is no tenderness. There is no guarding.  Musculoskeletal: Normal range of motion.  Neurological: She is alert and oriented to person, place, and time. No cranial nerve deficit.  Psychiatric: She has a normal mood and affect. Her behavior is normal.  Vitals reviewed.   Assessment/Plan: Encounter for annual routine gynecological examination  Cervical cancer screening - Plan: IGP, Aptima HPV  Screening for HPV (human papillomavirus) - Plan: IGP, Aptima HPV  Essential hypertension - Rx RF propranolol for 2 months. Prefer new PCP to manage meds. Pt has appt this month. - Plan: propranolol (INDERAL) 10 MG tablet  Vulvodynia - Rx RF cymbalta for 2 months. Prefer pt's PCP to manage. Has appt in 2 months. - Plan: DULoxetine (CYMBALTA) 20 MG capsule  Essential hypertension - Controlled with propranolol. Rx RF. F/u again at 1/19 annual. - Plan: propranolol (INDERAL) 10 MG tablet  Meds ordered this encounter  Medications  . propranolol (INDERAL) 10 MG tablet    Sig: TAKE 1 TABLET BY MOUTH 2 TIMES A DAY    Dispense:  60 tablet    Refill:  1    Order Specific Question:   Supervising Provider    Answer:   Nadara Mustard [id number]  . DULoxetine  (CYMBALTA) 20 MG capsule    Sig: Take 1 capsule (20 mg total) by mouth daily.    Dispense:  30 capsule    Refill:  1    Order Specific Question:   Supervising Provider    Answer:   Nadara Mustard [[id number]]             GYN counsel adequate intake of calcium and vitamin D, diet and exercise     F/U  Return in about 1 year (around 06/06/2018).  Rondalyn Belford B. Stelios Kirby, PA-C 06/05/2017 9:20 AM

## 2017-06-11 LAB — IGP, APTIMA HPV
HPV APTIMA: NEGATIVE
PAP Smear Comment: 0

## 2017-06-13 DIAGNOSIS — F411 Generalized anxiety disorder: Secondary | ICD-10-CM | POA: Diagnosis not present

## 2017-06-13 DIAGNOSIS — F339 Major depressive disorder, recurrent, unspecified: Secondary | ICD-10-CM | POA: Diagnosis not present

## 2017-06-14 ENCOUNTER — Other Ambulatory Visit: Payer: Self-pay | Admitting: Obstetrics and Gynecology

## 2017-06-27 DIAGNOSIS — E559 Vitamin D deficiency, unspecified: Secondary | ICD-10-CM | POA: Diagnosis not present

## 2017-06-27 DIAGNOSIS — E611 Iron deficiency: Secondary | ICD-10-CM | POA: Diagnosis not present

## 2017-06-27 DIAGNOSIS — R5382 Chronic fatigue, unspecified: Secondary | ICD-10-CM | POA: Diagnosis not present

## 2017-06-27 DIAGNOSIS — R635 Abnormal weight gain: Secondary | ICD-10-CM | POA: Diagnosis not present

## 2017-06-27 DIAGNOSIS — E538 Deficiency of other specified B group vitamins: Secondary | ICD-10-CM | POA: Diagnosis not present

## 2017-07-04 DIAGNOSIS — F339 Major depressive disorder, recurrent, unspecified: Secondary | ICD-10-CM | POA: Diagnosis not present

## 2017-07-04 DIAGNOSIS — F411 Generalized anxiety disorder: Secondary | ICD-10-CM | POA: Diagnosis not present

## 2017-07-18 DIAGNOSIS — F339 Major depressive disorder, recurrent, unspecified: Secondary | ICD-10-CM | POA: Diagnosis not present

## 2017-07-18 DIAGNOSIS — F411 Generalized anxiety disorder: Secondary | ICD-10-CM | POA: Diagnosis not present

## 2017-07-31 ENCOUNTER — Other Ambulatory Visit: Payer: Self-pay | Admitting: Obstetrics and Gynecology

## 2017-07-31 DIAGNOSIS — N94819 Vulvodynia, unspecified: Secondary | ICD-10-CM

## 2017-08-14 DIAGNOSIS — N94818 Other vulvodynia: Secondary | ICD-10-CM | POA: Diagnosis not present

## 2017-08-14 DIAGNOSIS — N9489 Other specified conditions associated with female genital organs and menstrual cycle: Secondary | ICD-10-CM | POA: Diagnosis not present

## 2017-08-14 DIAGNOSIS — Z6829 Body mass index (BMI) 29.0-29.9, adult: Secondary | ICD-10-CM | POA: Diagnosis not present

## 2017-08-15 DIAGNOSIS — F339 Major depressive disorder, recurrent, unspecified: Secondary | ICD-10-CM | POA: Diagnosis not present

## 2017-08-15 DIAGNOSIS — F411 Generalized anxiety disorder: Secondary | ICD-10-CM | POA: Diagnosis not present

## 2017-08-22 DIAGNOSIS — R635 Abnormal weight gain: Secondary | ICD-10-CM | POA: Diagnosis not present

## 2017-08-29 DIAGNOSIS — F411 Generalized anxiety disorder: Secondary | ICD-10-CM | POA: Diagnosis not present

## 2017-08-29 DIAGNOSIS — F339 Major depressive disorder, recurrent, unspecified: Secondary | ICD-10-CM | POA: Diagnosis not present

## 2017-09-07 ENCOUNTER — Other Ambulatory Visit: Payer: Self-pay | Admitting: Obstetrics and Gynecology

## 2017-09-07 DIAGNOSIS — N94819 Vulvodynia, unspecified: Secondary | ICD-10-CM

## 2017-09-10 ENCOUNTER — Encounter: Payer: Self-pay | Admitting: Obstetrics and Gynecology

## 2017-09-12 DIAGNOSIS — F339 Major depressive disorder, recurrent, unspecified: Secondary | ICD-10-CM | POA: Diagnosis not present

## 2017-09-12 DIAGNOSIS — F411 Generalized anxiety disorder: Secondary | ICD-10-CM | POA: Diagnosis not present

## 2017-09-16 ENCOUNTER — Ambulatory Visit: Payer: BLUE CROSS/BLUE SHIELD | Admitting: Obstetrics and Gynecology

## 2017-09-19 DIAGNOSIS — F411 Generalized anxiety disorder: Secondary | ICD-10-CM | POA: Diagnosis not present

## 2017-09-19 DIAGNOSIS — F331 Major depressive disorder, recurrent, moderate: Secondary | ICD-10-CM | POA: Diagnosis not present

## 2017-09-19 DIAGNOSIS — Z79899 Other long term (current) drug therapy: Secondary | ICD-10-CM | POA: Diagnosis not present

## 2017-09-26 DIAGNOSIS — N761 Subacute and chronic vaginitis: Secondary | ICD-10-CM | POA: Diagnosis not present

## 2017-09-26 DIAGNOSIS — Z6830 Body mass index (BMI) 30.0-30.9, adult: Secondary | ICD-10-CM | POA: Diagnosis not present

## 2017-09-29 ENCOUNTER — Telehealth: Payer: Self-pay

## 2017-09-29 NOTE — Telephone Encounter (Signed)
Pt mom calling wanting to know if there was something we could call in for her daughter , the lips of her vagina are burning , I advised her she needs to be seen

## 2017-10-02 DIAGNOSIS — R102 Pelvic and perineal pain: Secondary | ICD-10-CM | POA: Diagnosis not present

## 2017-10-02 DIAGNOSIS — G8929 Other chronic pain: Secondary | ICD-10-CM | POA: Diagnosis not present

## 2017-10-02 DIAGNOSIS — N9489 Other specified conditions associated with female genital organs and menstrual cycle: Secondary | ICD-10-CM | POA: Diagnosis not present

## 2017-10-02 DIAGNOSIS — N94819 Vulvodynia, unspecified: Secondary | ICD-10-CM | POA: Diagnosis not present

## 2017-10-02 DIAGNOSIS — M7918 Myalgia, other site: Secondary | ICD-10-CM | POA: Diagnosis not present

## 2017-10-03 DIAGNOSIS — R635 Abnormal weight gain: Secondary | ICD-10-CM | POA: Diagnosis not present

## 2017-10-03 DIAGNOSIS — F900 Attention-deficit hyperactivity disorder, predominantly inattentive type: Secondary | ICD-10-CM | POA: Diagnosis not present

## 2017-10-10 ENCOUNTER — Ambulatory Visit: Payer: BLUE CROSS/BLUE SHIELD

## 2017-10-10 DIAGNOSIS — F411 Generalized anxiety disorder: Secondary | ICD-10-CM | POA: Diagnosis not present

## 2017-10-10 DIAGNOSIS — F331 Major depressive disorder, recurrent, moderate: Secondary | ICD-10-CM | POA: Diagnosis not present

## 2017-10-10 DIAGNOSIS — F900 Attention-deficit hyperactivity disorder, predominantly inattentive type: Secondary | ICD-10-CM | POA: Diagnosis not present

## 2017-10-17 ENCOUNTER — Ambulatory Visit: Payer: BLUE CROSS/BLUE SHIELD | Attending: Obstetrics & Gynecology

## 2017-10-17 ENCOUNTER — Other Ambulatory Visit: Payer: Self-pay

## 2017-10-17 DIAGNOSIS — M791 Myalgia, unspecified site: Secondary | ICD-10-CM | POA: Diagnosis not present

## 2017-10-17 DIAGNOSIS — R293 Abnormal posture: Secondary | ICD-10-CM | POA: Insufficient documentation

## 2017-10-17 DIAGNOSIS — M62838 Other muscle spasm: Secondary | ICD-10-CM | POA: Diagnosis not present

## 2017-10-17 NOTE — Therapy (Signed)
[facility] MAIN [facility] SERVICES [address] [place], [place], [address] Phone: [phone]   Fax:  [phone]  Physical Therapy Evaluation  Patient Details  Name: Carrie Kennedy MRN: [id number] Date of Birth: 27-Jan-1986 Referring Provider: Eligha Bridegroom   Encounter Date: 10/17/2017  PT End of Session - 10/17/17 1120    Visit Number  1    Number of Visits  12    Date for PT Re-Evaluation  01/09/18    PT Start Time  1015    PT Stop Time  1106    PT Time Calculation (min)  51 min    Activity Tolerance  Patient tolerated treatment well    Behavior During Therapy  [facility] for tasks assessed/performed       Past Medical History:  Diagnosis Date  . Dysmenorrhea   . Hyperlipidemia   . Hypertension   . Tachycardia   . Vulvodynia     Past Surgical History:  Procedure Laterality Date  . EAR      TUBES    There were no vitals filed for this visit.  Pelvic Floor Physical Therapy Evaluation and Assessment  SCREENING  Falls in last 6 mo: no    Patient's communication preference:   Red Flags:  Have you had any night sweats? sometimes Unexplained weight loss? no Saddle anesthesia? no Unexplained changes in bowel or bladder habits? no  SUBJECTIVE  Patient reports: Had a late period, has been very stressed, this brought on pelvic pain. Had a bacterial infection but it has resolved. Her PFM has tightened up in response. When she has this type of flare-up she does not eat and just wants to ball up and cry.  Social/Family/Vocational History:   Full time x-ray tech at vet  Recent Procedures/Tests/Findings:  Wet-preps-one that showed bacterial vaginosis, urine culture-neg.  Obstetrical History: no  Gynecological History: none  Urinary History: Sometimes has to wait a little to begin stream of urine if PFM are tight and has held too long.  Gastrointestinal History: Has a BM every 1-2 days but is taking magnesium to stay  regular.  Sexual activity/pain: Was a little painful with initial penetration, decreasing with time. Has been some time since active.  Location of pain: Low back and mid back Current pain:  1/10  Max pain:  3/10 Least pain:  0/10 Nature of pain: tightness  Patient Goals: To decrease tightening in the PFM and loosen up.   OBJECTIVE  Posture/Observations:  Sitting: ankles crossed, slightly rounded forward shoulders.  Standing: B ER of LE, L hip elevated. Weight over heels , mild L ASIS and PSIS high.   Prone: L4-L5 are in-set with Sacral base and L1-3 more prominent suggesting early spondy-type instability  Palpation/Segmental Motion/Joint Play: decreased dmobility on L sacral and base of sacrum>R. TTP B through Lumbar paraspinals, QL, Piriformis, deep hip ER's Glute min. Decreased mobility to upper thoracic and lower lumbar regions as well as T_L junction.   Special tests:   Stork: L positive for instability. Scoliosis: R lumbar, l Thoracic curve (mild) Leg-length: R 1 cm long compared to L  Range of Motion/Flexibilty:  Spine: WNL for rotation and ~ 2.5 inches shy of normal SB B without pain Hips:   Strength/MMT:  LE MMT  LE MMT Left Right  Hip flex:  (L2) /5 /5  Hip ext: /5 /5  Hip abd: /5 /5  Hip add: /5 /5  Hip IR /5 /5  Hip ER /5 /5  Abdominal:  Palpation: TTP through B Psoas and Iliacus and through lower quadrant generally. Diastasis: none  Pelvic Floor External Exam: Deferred to next appointment Introitus Appears:  Skin integrity:  Palpation: Cough: Prolapse visible?: Scar mobility:  Internal Vaginal Exam: Deferred to next appointment Strength (PERF):  Symmetry: Palpation: Prolapse:   Internal Rectal Exam: Deferred indefinately Strength (PERF): Symmetry: Palpation: Prolapse:   Gait Analysis: Deferred to following visit.   Pelvic Floor Outcome Measures: VQ: 11/33 (33%) Female PFDI: 14/43 (33%)  INTERVENTIONS THIS  SESSION: Self-care: Educated on the structure and function of the pelvic floor in relation to their symptoms as well as the POC, and initial HEP in order to set patient expectations and understanding from which we will build on in the future sessions. Therex: Educated on and practiced bird-dog and piriformis stretch to improve deep core stability and decrease tension on sacral nerve roots to allow for relaxation of the PFM.  Manual: Performed TP release to B Piriformis and grade 3-4 PA mobs to L sacral border and base of sacrum to decrease strain on lumbo-sacral junction and improve ability to attain improved posture and decrease pain.  Total time: 51 min.       [facility] PT Assessment - 10/17/17 0001      Assessment   Medical Diagnosis  Pelvic Floor Dysfunction    Referring Provider  Eligha BridegroomMichelle Louis    Onset Date/Surgical Date  09/26/17    Prior Therapy  antibacterial for infetion, prior PT for previous episode      Balance Screen   Has the patient fallen in the past 6 months  No                Objective measurements completed on examination: See above findings.              PT Education - 10/17/17 1120    Education Details  See Pt. Instructions and Interventions this session.    Person(s) Educated  Patient    Methods  Explanation;Demonstration;Verbal cues;Handout    Comprehension  Verbalized understanding;Returned demonstration;Verbal cues required       PT Short Term Goals - 10/17/17 1139      PT SHORT TERM GOAL #1   Title  Patient will demonstrate improved sitting and standing posture to demonstrate learning and decrease stress on the pelvic floor with functional activity.    Baseline  Anterior pelvic tilt    Time  6    Period  Weeks    Status  New    Target Date  11/28/17      PT SHORT TERM GOAL #2   Title  Patient will demonstrate improved pelvic alignment and balance of musculature surrounding the pelvis to facilitate decreased PFM spasms and  decrease pelvic pain.    Baseline  L up-slip, R leg-long    Time  6    Period  Weeks    Status  New    Target Date  11/28/17      PT SHORT TERM GOAL #3   Title  Patient will report a reduction in pain to no greater than 1/10 over the prior week to demonstrate symptom improvement.    Baseline  3/10 over past week (debilitating during infection)    Time  6    Period  Weeks    Status  New    Target Date  11/28/17        PT Long Term Goals - 10/17/17 1141      PT LONG TERM  GOAL #1   Title  Patient will report no pain with intercourse or other vaginal penetration to demonstrate improved functional ability.    Baseline  Pain with initial penetration    Time  12    Period  Weeks    Target Date  01/09/18      PT LONG TERM GOAL #2   Title  Patient will score less than or equal to 15% on the Female NIH-CPSI to demonstrate a reduction in pain, urinary symptoms, and an improved quality of life.    Baseline  Female NIH-CPSI: 14/43 (33%),  VQ: 11/33 (33%)      PT LONG TERM GOAL #3   Title  Patient will be independent with HEP and stress-management routines in order to return to PLOF.    Time  12    Period  Weeks    Status  New    Target Date  01/09/18      PT LONG TERM GOAL #4   Title  Patient will describe pain no greater than 0/10 during normal daily activities to demonstrate improved functional ability.    Baseline  3/10 max    Time  12    Period  Weeks    Status  New    Target Date  01/09/18             Plan - 10/17/17 1132    Clinical Impression Statement  Pt. is a 32 y/o female who presents today with cheif c/o low back and pelvic pain as well as PFM tightness that increased recently due to a bacterial vaginosis diagnosis. She has relevant PMH of mild scoliosis and prior PFM spasmic episode that caused pelvic pain. Clinical exam reveals mild anterior pelvic tilt, L up-slip, R leg length ~ 1 cm greater than L, mild L lumbar R thoracic scoliosis, and spasms surrounding  the pelvis. She will benefit from skilled pelvic PT to address the noted defecits and to continue to assess for and address other potential causes of low back and pelvic pain.    Clinical Presentation  Stable    Clinical Decision Making  Moderate    Rehab Potential  Good    PT Frequency  1x / week    PT Duration  12 weeks    PT Treatment/Interventions  ADLs/Self Care Home Management;Biofeedback;Electrical Stimulation;Moist Heat;Functional mobility training;Therapeutic activities;Therapeutic exercise;Balance training;Patient/family education;Neuromuscular re-education;Manual techniques;Passive range of motion;Taping;Dry needling;Spinal Manipulations;Joint Manipulations    PT Next Visit Plan  Assess PFM and perform TP release PRN. Give further deep-core stability and scoliosis-reduction exercises and stretches. edducated on possible spondy    Consulted and Agree with Plan of Care  Patient       Patient will benefit from skilled therapeutic intervention in order to improve the following deficits and impairments:  Increased fascial restricitons, Improper body mechanics, Pain, Hypermobility, Increased muscle spasms, Postural dysfunction, Decreased activity tolerance, Decreased range of motion, Decreased balance  Visit Diagnosis: Other muscle spasm  Abnormal posture  Myalgia     Problem List Patient Active Problem List   Diagnosis Date Noted  . Tachycardia   . Dysmenorrhea   . Vulvodynia   . Hypertension 08/26/2016    Cleophus Molt 10/17/2017, 11:51 AM  [facility] MAIN [facility] SERVICES [address] [place], [place], [address] Phone: [phone]   Fax:  [phone]  Name: ARABELA BASALDUA MRN: [id number] Date of Birth: July 14, 1985

## 2017-10-17 NOTE — Patient Instructions (Signed)
Bracing With Arm / Leg Raise (Quadruped)    On hands and knees find neutral spine. Tighten pelvic floor and abdominals and hold. Alternating, lift arm to shoulder level and opposite leg to hip level. Repeat _10__ times. Do _3__ times a day. HANDS ONLY TO START.    Hold for 5 deep breaths and repeat 2-3 times on each side.

## 2017-10-24 DIAGNOSIS — F900 Attention-deficit hyperactivity disorder, predominantly inattentive type: Secondary | ICD-10-CM | POA: Diagnosis not present

## 2017-10-24 DIAGNOSIS — F331 Major depressive disorder, recurrent, moderate: Secondary | ICD-10-CM | POA: Diagnosis not present

## 2017-10-24 DIAGNOSIS — F411 Generalized anxiety disorder: Secondary | ICD-10-CM | POA: Diagnosis not present

## 2017-10-31 DIAGNOSIS — F331 Major depressive disorder, recurrent, moderate: Secondary | ICD-10-CM | POA: Diagnosis not present

## 2017-10-31 DIAGNOSIS — F900 Attention-deficit hyperactivity disorder, predominantly inattentive type: Secondary | ICD-10-CM | POA: Diagnosis not present

## 2017-10-31 DIAGNOSIS — F411 Generalized anxiety disorder: Secondary | ICD-10-CM | POA: Diagnosis not present

## 2017-10-31 DIAGNOSIS — R635 Abnormal weight gain: Secondary | ICD-10-CM | POA: Diagnosis not present

## 2017-11-07 DIAGNOSIS — F411 Generalized anxiety disorder: Secondary | ICD-10-CM | POA: Diagnosis not present

## 2017-11-07 DIAGNOSIS — F3341 Major depressive disorder, recurrent, in partial remission: Secondary | ICD-10-CM | POA: Diagnosis not present

## 2017-11-07 DIAGNOSIS — F900 Attention-deficit hyperactivity disorder, predominantly inattentive type: Secondary | ICD-10-CM | POA: Diagnosis not present

## 2017-11-21 DIAGNOSIS — F331 Major depressive disorder, recurrent, moderate: Secondary | ICD-10-CM | POA: Diagnosis not present

## 2017-11-21 DIAGNOSIS — F900 Attention-deficit hyperactivity disorder, predominantly inattentive type: Secondary | ICD-10-CM | POA: Diagnosis not present

## 2017-11-21 DIAGNOSIS — F411 Generalized anxiety disorder: Secondary | ICD-10-CM | POA: Diagnosis not present

## 2017-11-28 ENCOUNTER — Ambulatory Visit: Payer: BLUE CROSS/BLUE SHIELD | Attending: Obstetrics & Gynecology

## 2017-11-28 DIAGNOSIS — M62838 Other muscle spasm: Secondary | ICD-10-CM | POA: Diagnosis not present

## 2017-11-28 DIAGNOSIS — Z79899 Other long term (current) drug therapy: Secondary | ICD-10-CM | POA: Diagnosis not present

## 2017-11-28 DIAGNOSIS — R293 Abnormal posture: Secondary | ICD-10-CM | POA: Diagnosis not present

## 2017-11-28 DIAGNOSIS — F331 Major depressive disorder, recurrent, moderate: Secondary | ICD-10-CM | POA: Diagnosis not present

## 2017-11-28 DIAGNOSIS — F411 Generalized anxiety disorder: Secondary | ICD-10-CM | POA: Diagnosis not present

## 2017-11-28 DIAGNOSIS — F900 Attention-deficit hyperactivity disorder, predominantly inattentive type: Secondary | ICD-10-CM | POA: Diagnosis not present

## 2017-11-28 DIAGNOSIS — M791 Myalgia, unspecified site: Secondary | ICD-10-CM | POA: Insufficient documentation

## 2017-11-28 NOTE — Patient Instructions (Signed)
Stabilization: Diaphragmatic Breathing    Lie with knees bent, feet flat. Place one hand on stomach, other on chest. Breathe deeply through nose, lifting belly hand without any motion of hand on chest. 5 minutes each night before sleep, feeling the muscles gently relaxing open as you inhale.  Child's Pose Pelvic Floor Lengthening    Sit in knee-chest position and reach arms forward. Separate knees for comfort. Hold position for _5__ breaths. Repeat _2-3__ times. Do _1-2__ times per day.  Kegel exercises:    With neutral spine, tighten pelvic floor by imagining you are stopping the flow of urine, squeezing only around the vagina and anus.  Quick-Flicks: Pull up and in quickly and then relax allowing just enough time for the muscles to full lengthen before the next contraction. Do _10__ repetitions in a row, stopping if the pelvic floor muscle gets tired and other muscles try to take over.  Long-Holds: Hold for _3__ seconds and then fully release, repeat _5__ times.   Repeat both of these exercises _5__ times throughout the day

## 2017-11-28 NOTE — Therapy (Signed)
[facility] MAIN [facility] SERVICES [address] [place], [place], [address] Phone: [phone]   Fax:  [phone]  Physical Therapy Treatment  Patient Details  Name: Carrie Kennedy MRN: [id number] Date of Birth: [birth year] Referring Provider (PT): Eligha Bridegroom   Encounter Date: 11/28/2017  PT End of Session - 11/29/17 0956    Visit Number  2    Number of Visits  12    Date for PT Re-Evaluation  01/09/18    PT Start Time  0815    PT Stop Time  0910    PT Time Calculation (min)  55 min    Activity Tolerance  Patient tolerated treatment well    Behavior During Therapy  [facility] for tasks assessed/performed       Past Medical History:  Diagnosis Date  . Dysmenorrhea   . Hyperlipidemia   . Hypertension   . Tachycardia   . Vulvodynia     Past Surgical History:  Procedure Laterality Date  . EAR      TUBES    There were no vitals filed for this visit.    Pelvic Floor Physical Therapy Treatment Note  SCREENING  Changes in medications, allergies, or medical history?: no   SUBJECTIVE  Patient reports: Doing fine with exercises.  Pain update:  Location of pain: low and mid back Current pain:  3/10  Max pain:  6/10 Least pain:  0/10 Nature of pain: dull achy  Patient Goals: To decrease tightening in the PFM and loosen up.   OBJECTIVE  Changes in: :  Pelvic floor: External Exam:  Introitus Appears: normal Skin integrity: normal Palpation: TTP on R>L throughout Cough: Paradoxical Prolapse visible?: no Scar mobility: not assessed  Internal Vaginal Exam:  Strength (PERF): 0,0,0 (2/5 following coordination training) Symmetry: R>L for spasm Palpation: TTP throughout, R>L Prolapse: none  INTERVENTIONS THIS SESSION: Manual: Performed TP release internally to R posterior PR/PC and OI to decrease spasm, pain, and increase ability to feel and recruit PFM. NM re-ed: Educated on how to feel lengthening vs. Squeeze of  PFM to improve coordination and recruitment and decrease spasm. Therex: educated on and practiced diaphragmatic breathing, child's pose and kegels (3 sec. Holds) to lengthen and improve coordination and strength of the PFM for decreased pain and improved function.  Total time: 55 min.                         PT Education - 11/29/17 0956    Education Details  See Pt. Instructions and Interventions this session    Person(s) Educated  Patient    Methods  Explanation;Verbal cues;Handout    Comprehension  Verbalized understanding;Verbal cues required;Tactile cues required;Need further instruction       PT Short Term Goals - 10/17/17 1139      PT SHORT TERM GOAL #1   Title  Patient will demonstrate improved sitting and standing posture to demonstrate learning and decrease stress on the pelvic floor with functional activity.    Baseline  Anterior pelvic tilt    Time  6    Period  Weeks    Status  New    Target Date  11/28/17      PT SHORT TERM GOAL #2   Title  Patient will demonstrate improved pelvic alignment and balance of musculature surrounding the pelvis to facilitate decreased PFM spasms and decrease pelvic pain.    Baseline  L up-slip, R leg-long    Time  6    Period  Weeks    Status  New    Target Date  11/28/17      PT SHORT TERM GOAL #3   Title  Patient will report a reduction in pain to no greater than 1/10 over the prior week to demonstrate symptom improvement.    Baseline  3/10 over past week (debilitating during infection)    Time  6    Period  Weeks    Status  New    Target Date  11/28/17        PT Long Term Goals - 10/17/17 1141      PT LONG TERM GOAL #1   Title  Patient will report no pain with intercourse or other vaginal penetration to demonstrate improved functional ability.    Baseline  Pain with initial penetration    Time  12    Period  Weeks    Target Date  01/09/18      PT LONG TERM GOAL #2   Title  Patient will score less  than or equal to 15% on the Female NIH-CPSI to demonstrate a reduction in pain, urinary symptoms, and an improved quality of life.    Baseline  Female NIH-CPSI: 14/43 (33%),  VQ: 11/33 (33%)      PT LONG TERM GOAL #3   Title  Patient will be independent with HEP and stress-management routines in order to return to PLOF.    Time  12    Period  Weeks    Status  New    Target Date  01/09/18      PT LONG TERM GOAL #4   Title  Patient will describe pain no greater than 0/10 during normal daily activities to demonstrate improved functional ability.    Baseline  3/10 max    Time  12    Period  Weeks    Status  New    Target Date  01/09/18            Plan - 11/29/17 0957    Clinical Impression Statement  Pt. responded well to all interventions today, demonstrating decreased pain and spasm an understanding of all education provided. Continue per POC.    Clinical Presentation  Stable    Clinical Decision Making  Moderate    Rehab Potential  Good    PT Frequency  1x / week    PT Duration  12 weeks    PT Treatment/Interventions  ADLs/Self Care Home Management;Biofeedback;Electrical Stimulation;Moist Heat;Functional mobility training;Therapeutic activities;Therapeutic exercise;Balance training;Patient/family education;Neuromuscular re-education;Manual techniques;Passive range of motion;Taping;Dry needling;Spinal Manipulations;Joint Manipulations    PT Next Visit Plan  Assess PFM and perform TP release PRN. Give further deep-core stability and scoliosis-reduction exercises and stretches. edducated on possible spondy    Consulted and Agree with Plan of Care  Patient       Patient will benefit from skilled therapeutic intervention in order to improve the following deficits and impairments:  Increased fascial restricitons, Improper body mechanics, Pain, Hypermobility, Increased muscle spasms, Postural dysfunction, Decreased activity tolerance, Decreased range of motion, Decreased  balance  Visit Diagnosis: Other muscle spasm  Abnormal posture  Myalgia     Problem List Patient Active Problem List   Diagnosis Date Noted  . Tachycardia   . Dysmenorrhea   . Vulvodynia   . Hypertension 08/26/2016   Cleophus Molt DPT, ATC Cleophus Molt 11/29/2017, 9:59 AM  [place] [facility] MAIN [facility] SERVICES [address] [place], [place],  [address] Phone: [phone]   Fax:  [phone]  Name: Carrie Kennedy MRN: [id number] Date of Birth: Jun 08, 1985

## 2017-12-05 ENCOUNTER — Ambulatory Visit: Payer: BLUE CROSS/BLUE SHIELD | Attending: Obstetrics & Gynecology

## 2017-12-05 DIAGNOSIS — F900 Attention-deficit hyperactivity disorder, predominantly inattentive type: Secondary | ICD-10-CM | POA: Diagnosis not present

## 2017-12-05 DIAGNOSIS — M791 Myalgia, unspecified site: Secondary | ICD-10-CM | POA: Diagnosis present

## 2017-12-05 DIAGNOSIS — F3341 Major depressive disorder, recurrent, in partial remission: Secondary | ICD-10-CM | POA: Diagnosis not present

## 2017-12-05 DIAGNOSIS — F411 Generalized anxiety disorder: Secondary | ICD-10-CM | POA: Diagnosis not present

## 2017-12-05 DIAGNOSIS — R293 Abnormal posture: Secondary | ICD-10-CM

## 2017-12-05 DIAGNOSIS — M62838 Other muscle spasm: Secondary | ICD-10-CM | POA: Diagnosis not present

## 2017-12-05 NOTE — Therapy (Signed)
[facility] MAIN [facility] SERVICES [address] [place], [place], [address] Phone: [phone]   Fax:  [phone]  Physical Therapy Treatment  Patient Details  Name: Carrie Kennedy MRN: [id number] Date of Birth: 02/28/1985 Referring Provider (PT): Eligha Bridegroom   Encounter Date: 12/05/2017  PT End of Session - 12/05/17 0913    Visit Number  3    Number of Visits  12    Date for PT Re-Evaluation  01/09/18    PT Start Time  0805    PT Stop Time  0905    PT Time Calculation (min)  60 min    Activity Tolerance  Patient tolerated treatment well    Behavior During Therapy  [facility] for tasks assessed/performed       Past Medical History:  Diagnosis Date  . Dysmenorrhea   . Hyperlipidemia   . Hypertension   . Tachycardia   . Vulvodynia     Past Surgical History:  Procedure Laterality Date  . EAR      TUBES    There were no vitals filed for this visit.    Pelvic Floor Physical Therapy Treatment Note  SCREENING  Changes in medications, allergies, or medical history?: no    SUBJECTIVE  Patient reports: Doing well with exercises  Pain update:  Location of pain: low and mid back, PF is ~ 1/10 Current pain:  4/10  Max pain:  6/10 Least pain:  0/10 Nature of pain: dull achy  Patient Goals: To decrease tightening in the PFM and loosen up.   OBJECTIVE  Changes in: Posture/Observations:  R lumbar scoliosis  Range of Motion/Flexibilty:  Decreased thoracic rotation B with R>L for limitation  Palpation: TTP to B QL, Lumbar paraspinals, and Glute min  INTERVENTIONS THIS SESSION: Manual:Performed TP release to B QL, Lumbar paraspinals, and Glute min to decrease pain and spasm, Educated on how to use tennis ball at home to perform self TP release for further spasm and pain reduction. Therex: Educated on and practiced side-stretch, side-plank, bow and arrow, and hip flexor stretch to decrease imbalance of musculature  surrounding the pelvis and scoliotic curve to decrease pain and spasm and allow PFM to relax..   Total time:                         PT Education - 12/05/17 0912    Education Details  See Pt. instructions and Interventions this session.    Person(s) Educated  Patient    Methods  Explanation;Demonstration;Verbal cues;Handout    Comprehension  Verbalized understanding;Returned demonstration;Verbal cues required;Tactile cues required       PT Short Term Goals - 10/17/17 1139      PT SHORT TERM GOAL #1   Title  Patient will demonstrate improved sitting and standing posture to demonstrate learning and decrease stress on the pelvic floor with functional activity.    Baseline  Anterior pelvic tilt    Time  6    Period  Weeks    Status  New    Target Date  11/28/17      PT SHORT TERM GOAL #2   Title  Patient will demonstrate improved pelvic alignment and balance of musculature surrounding the pelvis to facilitate decreased PFM spasms and decrease pelvic pain.    Baseline  L up-slip, R leg-long    Time  6    Period  Weeks    Status  New    Target Date  11/28/17      PT SHORT TERM GOAL #3   Title  Patient will report a reduction in pain to no greater than 1/10 over the prior week to demonstrate symptom improvement.    Baseline  3/10 over past week (debilitating during infection)    Time  6    Period  Weeks    Status  New    Target Date  11/28/17        PT Long Term Goals - 10/17/17 1141      PT LONG TERM GOAL #1   Title  Patient will report no pain with intercourse or other vaginal penetration to demonstrate improved functional ability.    Baseline  Pain with initial penetration    Time  12    Period  Weeks    Target Date  01/09/18      PT LONG TERM GOAL #2   Title  Patient will score less than or equal to 15% on the Female NIH-CPSI to demonstrate a reduction in pain, urinary symptoms, and an improved quality of life.    Baseline  Female NIH-CPSI:  14/43 (33%),  VQ: 11/33 (33%)      PT LONG TERM GOAL #3   Title  Patient will be independent with HEP and stress-management routines in order to return to PLOF.    Time  12    Period  Weeks    Status  New    Target Date  01/09/18      PT LONG TERM GOAL #4   Title  Patient will describe pain no greater than 0/10 during normal daily activities to demonstrate improved functional ability.    Baseline  3/10 max    Time  12    Period  Weeks    Status  New    Target Date  01/09/18            Plan - 12/05/17 0913    Clinical Impression Statement  Pt. responded well to all interventions today, demonstrating decreased spasms, pain ( from 4/10 to 2.5/10), and understanding of all education and exercises provided. Continue per POC.    Clinical Presentation  Stable    Clinical Decision Making  Moderate    Rehab Potential  Good    PT Frequency  1x / week    PT Duration  12 weeks    PT Treatment/Interventions  ADLs/Self Care Home Management;Biofeedback;Electrical Stimulation;Moist Heat;Functional mobility training;Therapeutic activities;Therapeutic exercise;Balance training;Patient/family education;Neuromuscular re-education;Manual techniques;Passive range of motion;Taping;Dry needling;Spinal Manipulations;Joint Manipulations    PT Next Visit Plan  Give further deep-core stability Perform further TP release to muscles surrounding hips and mobs to hips and spine for improved alignment. edducated on possible spondy    Consulted and Agree with Plan of Care  Patient       Patient will benefit from skilled therapeutic intervention in order to improve the following deficits and impairments:  Increased fascial restricitons, Improper body mechanics, Pain, Hypermobility, Increased muscle spasms, Postural dysfunction, Decreased activity tolerance, Decreased range of motion, Decreased balance  Visit Diagnosis: Other muscle spasm  Abnormal posture  Myalgia     Problem List Patient Active  Problem List   Diagnosis Date Noted  . Tachycardia   . Dysmenorrhea   . Vulvodynia   . Hypertension 08/26/2016   Cleophus Molt DPT, ATC Cleophus Molt 12/05/2017, 9:16 AM  [place] MAIN [facility] SERVICES [address] [place], [place], [address] Phone: [phone]   Fax:  [phone]  Name: Carrie Kennedy MRN: [id number] Date of Birth: January 03, 1986

## 2017-12-05 NOTE — Patient Instructions (Addendum)
   Hold for 30 seconds (5 deep breaths) and repeat 2-3 times on each side once a day     * Keep bottom leg straight, top knee bent. Inhale forward with hand sliding along your ribs and exhale as you twist back, letting all the air out. Repeat 2x15 rotations with the left side-down (r side 1-2 times as needed). Hold for 5 deep breaths at the end to feel a deep stretch.        Hold for a total of 1:30, broken down into as small of chunks as necessary, building up to longer holds over time. Perform with the left side-down once per day.   Flexors, Lunge  Hip Flexor Stretch: Proposal Pose    Maintain pelvic tuck under, lift pubic bone toward navel. Engage posterior hip muscles (firm glute muscles of leg in back position) and shift forward until you feel stretch on front of leg that is down. To increase stretch, maintain balance and ease hips forward. You may use one hand on a chair for balance if needed. Hold for __5__ breaths. Repeat __2-3__ times each leg.  Do _1-2__ times per day.

## 2017-12-12 ENCOUNTER — Ambulatory Visit: Payer: BLUE CROSS/BLUE SHIELD

## 2017-12-12 DIAGNOSIS — M62838 Other muscle spasm: Secondary | ICD-10-CM

## 2017-12-12 DIAGNOSIS — M791 Myalgia, unspecified site: Secondary | ICD-10-CM

## 2017-12-12 DIAGNOSIS — R293 Abnormal posture: Secondary | ICD-10-CM

## 2017-12-12 NOTE — Therapy (Signed)
[facility] MAIN [facility] SERVICES [address] [place], [place], [address] Phone: [phone]   Fax:  [phone]  Physical Therapy Treatment  Patient Details  Name: Carrie Kennedy MRN: [id number] Date of Birth: 07-Mar-1985 Referring Provider (PT): Eligha Bridegroom   Encounter Date: 12/12/2017  PT End of Session - 12/12/17 1725    Visit Number  4    Number of Visits  12    Date for PT Re-Evaluation  01/09/18    PT Start Time  0805    PT Stop Time  0910    PT Time Calculation (min)  65 min    Activity Tolerance  Patient tolerated treatment well    Behavior During Therapy  [facility] for tasks assessed/performed       Past Medical History:  Diagnosis Date  . Dysmenorrhea   . Hyperlipidemia   . Hypertension   . Tachycardia   . Vulvodynia     Past Surgical History:  Procedure Laterality Date  . EAR      TUBES    There were no vitals filed for this visit.    Pelvic Floor Physical Therapy Treatment Note  SCREENING  Changes in medications, allergies, or medical history?: no    SUBJECTIVE  Patient reports: Doing alright, PFM feels good but low back is still achy  Pain update:  Location of pain: Low back Current pain:  3/10  Max pain:  4/10 Least pain:  2/10 Nature of pain: Dull achy  *Pain is 2/10 following treatment, improved ROM  Patient Goals: To decrease tightening in the PFM and loosen up.   OBJECTIVE  Changes in: Posture/Observations:  Hyperlordosis, slight L posterior rotation  Range of Motion/Flexibilty:  Decreased lumbar flexion ROM, improved but still decreased following treatment.  Palpation: TTP through B lumbar paraspinals, multifidus, and QL  INTERVENTIONS THIS SESSION: Manual: Performed STM and TP release to B lumbar paraspinals, multifidus, and QL to decrease spasm and pain and allow for improved posture for sustained spasm reduction. Dry-needle: Performed TPDN  With .30x46mm needle to B lumbar  paraspinals, multifidus, and QL to decrease spasm and pain and allow for improved posture for sustained spasm reduction.  Total time: 65 min.                    Trigger Point Dry Needling - 12/12/17 1725    Consent Given?  Yes    Education Handout Provided  No    Muscles Treated Upper Body  Longissimus;Quadratus Lumborum   multifidus   Longissimus Response  Twitch response elicited;Palpable increased muscle length           PT Education - 12/12/17 1724    Education Details  Educated on what to expect from dry needling, precaustions and risks.    Person(s) Educated  Patient    Methods  Explanation    Comprehension  Verbalized understanding       PT Short Term Goals - 10/17/17 1139      PT SHORT TERM GOAL #1   Title  Patient will demonstrate improved sitting and standing posture to demonstrate learning and decrease stress on the pelvic floor with functional activity.    Baseline  Anterior pelvic tilt    Time  6    Period  Weeks    Status  New    Target Date  11/28/17      PT SHORT TERM GOAL #2   Title  Patient will demonstrate improved pelvic alignment and balance  of musculature surrounding the pelvis to facilitate decreased PFM spasms and decrease pelvic pain.    Baseline  L up-slip, R leg-long    Time  6    Period  Weeks    Status  New    Target Date  11/28/17      PT SHORT TERM GOAL #3   Title  Patient will report a reduction in pain to no greater than 1/10 over the prior week to demonstrate symptom improvement.    Baseline  3/10 over past week (debilitating during infection)    Time  6    Period  Weeks    Status  New    Target Date  11/28/17        PT Long Term Goals - 10/17/17 1141      PT LONG TERM GOAL #1   Title  Patient will report no pain with intercourse or other vaginal penetration to demonstrate improved functional ability.    Baseline  Pain with initial penetration    Time  12    Period  Weeks    Target Date  01/09/18       PT LONG TERM GOAL #2   Title  Patient will score less than or equal to 15% on the Female NIH-CPSI to demonstrate a reduction in pain, urinary symptoms, and an improved quality of life.    Baseline  Female NIH-CPSI: 14/43 (33%),  VQ: 11/33 (33%)      PT LONG TERM GOAL #3   Title  Patient will be independent with HEP and stress-management routines in order to return to PLOF.    Time  12    Period  Weeks    Status  New    Target Date  01/09/18      PT LONG TERM GOAL #4   Title  Patient will describe pain no greater than 0/10 during normal daily activities to demonstrate improved functional ability.    Baseline  3/10 max    Time  12    Period  Weeks    Status  New    Target Date  01/09/18            Plan - 12/12/17 1726    Clinical Impression Statement  Pt. responded well to all interventions today, demonstrating mild dizziness upon sitting which resloved quickly and understanding of all education provided as well as a 1 pint reduction in pain following treatment. Continue per POC.    Clinical Presentation  Stable    Clinical Decision Making  Moderate    Rehab Potential  Good    PT Frequency  1x / week    PT Duration  12 weeks    PT Treatment/Interventions  ADLs/Self Care Home Management;Biofeedback;Electrical Stimulation;Moist Heat;Functional mobility training;Therapeutic activities;Therapeutic exercise;Balance training;Patient/family education;Neuromuscular re-education;Manual techniques;Passive range of motion;Taping;Dry needling;Spinal Manipulations;Joint Manipulations    PT Next Visit Plan  Give further deep-core stability Perform further DN to hip-flexors and glutes and mobs to hips and spine for improved alignment. edducated on possible spondy    PT Home Exercise Plan  Bird-dog, piriformis stretch, child's pose, kegels, diaphragmatic breathing, hip-flexor stretch, side-stretch, bow-and-arrow, side-plank.    Consulted and Agree with Plan of Care  Patient       Patient will  benefit from skilled therapeutic intervention in order to improve the following deficits and impairments:  Increased fascial restricitons, Improper body mechanics, Pain, Hypermobility, Increased muscle spasms, Postural dysfunction, Decreased activity tolerance, Decreased range of motion, Decreased balance  Visit Diagnosis: Other  muscle spasm  Abnormal posture  Myalgia     Problem List Patient Active Problem List   Diagnosis Date Noted  . Tachycardia   . Dysmenorrhea   . Vulvodynia   . Hypertension 08/26/2016   Cleophus Molt DPT, ATC Cleophus Molt 12/12/2017, 5:37 PM  [place] [facility] MAIN [facility] SERVICES [address] [place], [place], [address] Phone: [phone]   Fax:  [phone]  Name: Carrie Kennedy MRN: [id number] Date of Birth: 06/28/1985

## 2017-12-19 ENCOUNTER — Ambulatory Visit: Payer: BLUE CROSS/BLUE SHIELD

## 2017-12-19 DIAGNOSIS — M791 Myalgia, unspecified site: Secondary | ICD-10-CM

## 2017-12-19 DIAGNOSIS — M62838 Other muscle spasm: Secondary | ICD-10-CM

## 2017-12-19 DIAGNOSIS — F411 Generalized anxiety disorder: Secondary | ICD-10-CM | POA: Diagnosis not present

## 2017-12-19 DIAGNOSIS — R293 Abnormal posture: Secondary | ICD-10-CM

## 2017-12-19 DIAGNOSIS — F900 Attention-deficit hyperactivity disorder, predominantly inattentive type: Secondary | ICD-10-CM | POA: Diagnosis not present

## 2017-12-19 DIAGNOSIS — F3341 Major depressive disorder, recurrent, in partial remission: Secondary | ICD-10-CM | POA: Diagnosis not present

## 2017-12-19 NOTE — Patient Instructions (Signed)
   Hold for a total of 1 min. Per day, splitting up into smaller segments as needed to start, add single-arm lifts when it gets easy.

## 2017-12-19 NOTE — Therapy (Signed)
[facility] MAIN [facility] SERVICES [address] [place], [place], [address] Phone: [phone]   Fax:  [phone]  Physical Therapy Treatment  Patient Details  Name: Carrie Kennedy MRN: [id number] Date of Birth: April 19, 1985 Referring Provider (PT): Eligha Bridegroom   Encounter Date: 12/19/2017  PT End of Session - 12/25/17 1253    Visit Number  5    Number of Visits  12    Date for PT Re-Evaluation  01/09/18    PT Start Time  0800    PT Stop Time  0900    PT Time Calculation (min)  60 min    Activity Tolerance  Patient tolerated treatment well    Behavior During Therapy  [facility] for tasks assessed/performed       Past Medical History:  Diagnosis Date  . Dysmenorrhea   . Hyperlipidemia   . Hypertension   . Tachycardia   . Vulvodynia     Past Surgical History:  Procedure Laterality Date  . EAR      TUBES    There were no vitals filed for this visit.   Pelvic Floor Physical Therapy Treatment Note  SCREENING  Changes in medications, allergies, or medical history? no   SUBJECTIVE  Patient reports: She is doing "not too shabby" she has not had any internal pain. "stiff in her pelvic wings.    Pain update:  Location of pain: low back Current pain:  1/10  Max pain:  2.5/10 Least pain:  1/10 Nature of pain: achy  Patient Goals: To decrease tightening in the PFM and loosen up.   OBJECTIVE  Changes in: Posture/Observations:  PSIS and ASIS are level. Hyperlordotic.  Range of Motion/Flexibilty:  Decreased hip ext B.  Palpation: TTP to B Psoas and Iliacus, refferriing into LB and pelvis.  INTERVENTIONS THIS SESSION: Manual: Performed STM and TP release to B Psoas and Iliacus with slow response to decrease spasm and pain and allow for improved hip ROM and posture. Dry-needle: Performed  TPDN to B Iliacus and R Psoas to decrease spasm and pain and allow for improved hip ROM and posture. Therex: reviewed safety  considerations for returning to higher intensity exercise and how to progress with core strengthening to prevent return of spasm. Given a knee plank with instructions on how to progress to increase deep and superficial core strength.  Total time: 60 min.                           PT Education - 12/25/17 1252    Education Details  See Pt. Instructions and Interventions this session    Person(s) Educated  Patient    Methods  Explanation;Demonstration;Verbal cues;Handout    Comprehension  Verbalized understanding;Returned demonstration;Verbal cues required       PT Short Term Goals - 10/17/17 1139      PT SHORT TERM GOAL #1   Title  Patient will demonstrate improved sitting and standing posture to demonstrate learning and decrease stress on the pelvic floor with functional activity.    Baseline  Anterior pelvic tilt    Time  6    Period  Weeks    Status  New    Target Date  11/28/17      PT SHORT TERM GOAL #2   Title  Patient will demonstrate improved pelvic alignment and balance of musculature surrounding the pelvis to facilitate decreased PFM spasms and decrease pelvic pain.    Baseline  L up-slip, R leg-long    Time  6    Period  Weeks    Status  New    Target Date  11/28/17      PT SHORT TERM GOAL #3   Title  Patient will report a reduction in pain to no greater than 1/10 over the prior week to demonstrate symptom improvement.    Baseline  3/10 over past week (debilitating during infection)    Time  6    Period  Weeks    Status  New    Target Date  11/28/17        PT Long Term Goals - 10/17/17 1141      PT LONG TERM GOAL #1   Title  Patient will report no pain with intercourse or other vaginal penetration to demonstrate improved functional ability.    Baseline  Pain with initial penetration    Time  12    Period  Weeks    Target Date  01/09/18      PT LONG TERM GOAL #2   Title  Patient will score less than or equal to 15% on the Female  NIH-CPSI to demonstrate a reduction in pain, urinary symptoms, and an improved quality of life.    Baseline  Female NIH-CPSI: 14/43 (33%),  VQ: 11/33 (33%)      PT LONG TERM GOAL #3   Title  Patient will be independent with HEP and stress-management routines in order to return to PLOF.    Time  12    Period  Weeks    Status  New    Target Date  01/09/18      PT LONG TERM GOAL #4   Title  Patient will describe pain no greater than 0/10 during normal daily activities to demonstrate improved functional ability.    Baseline  3/10 max    Time  12    Period  Weeks    Status  New    Target Date  01/09/18            Plan - 12/25/17 1253    Clinical Impression Statement  Pt. responded slowly to TP release so decision was made to incorporate TPDN, which was very effective for her though we were not able to get to all areas on the L hip flexors and may require further treatment at next visit. She demonstrated understanding of all education provided correct performance of exercises. Continue per POC.    Clinical Presentation  Stable    Clinical Decision Making  Moderate    Rehab Potential  Good    PT Frequency  1x / week    PT Duration  12 weeks    PT Treatment/Interventions  ADLs/Self Care Home Management;Biofeedback;Electrical Stimulation;Moist Heat;Functional mobility training;Therapeutic activities;Therapeutic exercise;Balance training;Patient/family education;Neuromuscular re-education;Manual techniques;Passive range of motion;Taping;Dry needling;Spinal Manipulations;Joint Manipulations    PT Next Visit Plan  Perform further DN to hip-flexors and glutes and mobs to hips and spine for improved alignment. edducated on possible spondy Incorporate posterior hip strengthening.    PT Home Exercise Plan  Bird-dog, piriformis stretch, child's pose, kegels, diaphragmatic breathing, hip-flexor stretch, side-stretch, bow-and-arrow, side-plank, knee plank.    Consulted and Agree with Plan of Care   Patient       Patient will benefit from skilled therapeutic intervention in order to improve the following deficits and impairments:  Increased fascial restricitons, Improper body mechanics, Pain, Hypermobility, Increased muscle spasms, Postural dysfunction, Decreased activity tolerance, Decreased range of motion, Decreased balance  Visit Diagnosis: Other muscle spasm  Abnormal posture  Myalgia     Problem List Patient Active Problem List   Diagnosis Date Noted  . Tachycardia   . Dysmenorrhea   . Vulvodynia   . Hypertension 08/26/2016   Cleophus MoltKeeli T. Gailes DPT, ATC Cleophus MoltKeeli T Gailes 12/25/2017, 12:58 PM  [facility] MAIN [facility] [address] , [place], [address] Phone: [phone]   Fax:  [phone]  Name: Retta DionesKari M Maynes MRN: [id number] Date of Birth: [birth year]

## 2017-12-26 DIAGNOSIS — F3341 Major depressive disorder, recurrent, in partial remission: Secondary | ICD-10-CM | POA: Diagnosis not present

## 2017-12-26 DIAGNOSIS — F411 Generalized anxiety disorder: Secondary | ICD-10-CM | POA: Diagnosis not present

## 2017-12-26 DIAGNOSIS — F900 Attention-deficit hyperactivity disorder, predominantly inattentive type: Secondary | ICD-10-CM | POA: Diagnosis not present

## 2017-12-30 DIAGNOSIS — N9489 Other specified conditions associated with female genital organs and menstrual cycle: Secondary | ICD-10-CM | POA: Diagnosis not present

## 2017-12-30 DIAGNOSIS — R102 Pelvic and perineal pain: Secondary | ICD-10-CM | POA: Diagnosis not present

## 2017-12-30 DIAGNOSIS — G8929 Other chronic pain: Secondary | ICD-10-CM | POA: Diagnosis not present

## 2017-12-30 DIAGNOSIS — N94819 Vulvodynia, unspecified: Secondary | ICD-10-CM | POA: Diagnosis not present

## 2018-01-09 ENCOUNTER — Ambulatory Visit: Payer: BLUE CROSS/BLUE SHIELD | Attending: Obstetrics & Gynecology

## 2018-01-09 DIAGNOSIS — M791 Myalgia, unspecified site: Secondary | ICD-10-CM | POA: Diagnosis present

## 2018-01-09 DIAGNOSIS — M62838 Other muscle spasm: Secondary | ICD-10-CM | POA: Insufficient documentation

## 2018-01-09 DIAGNOSIS — R293 Abnormal posture: Secondary | ICD-10-CM | POA: Diagnosis present

## 2018-01-09 NOTE — Patient Instructions (Signed)
Hip Abduction (Standing)    Stand with support. Squeeze pelvic floor and hold. Lift right leg out to side, keeping toe forward. Hold for _2__ seconds. Relax for _1__ seconds. Repeat __10_ times. Do _3__ sets  a day.  HIP Extension - Standing    Draw lower tummy muscle (TA) and pelvic floor in, raise and lift leg backward squeezing glutes. Keep knee straight or slightly bent. _10__ reps per set, _3__ sets per day. Hold onto a support as lightly as safely possible.

## 2018-01-09 NOTE — Therapy (Signed)
[place] [facility] [address] [place], [place], [address] Phone: [phone]   Fax:  [phone]  Physical Therapy Treatment  Patient Details  Name: Carrie DionesKari M Kennedy MRN: [id number] Date of Birth: [birth year] Referring Provider (PT): Eligha BridegroomMichelle Louis   Encounter Date: 01/09/2018  PT End of Session - 01/09/18 1256    Visit Number  6    Number of Visits  12    Date for PT Re-Evaluation  01/09/18    PT Start Time  0805    PT Stop Time  0905    PT Time Calculation (min)  60 min    Activity Tolerance  Patient tolerated treatment well    Behavior During Therapy  [facility] for tasks assessed/performed       Past Medical History:  Diagnosis Date  . Dysmenorrhea   . Hyperlipidemia   . Hypertension   . Tachycardia   . Vulvodynia     Past Surgical History:  Procedure Laterality Date  . EAR      TUBES    There were no vitals filed for this visit.    Pelvic Floor Physical Therapy Treatment Note  SCREENING  Changes in medications, allergies, or medical history?: upped dosage of nortriptyline and propanolol.    SUBJECTIVE  Patient reports: Has had a rough week emotionally,   Pain update:  Location of pain: low back Current pain:  1/10  Max pain:  3/10 Least pain:  0/10 Nature of pain: achy  Patient Goals: To decrease tightening in the PFM and loosen up.   OBJECTIVE  Changes in: Posture/Observations:  Anterior pelvic tilt.  Range of Motion/Flexibilty:  Decreased hip EXT ROM.  Palpation: TTP to B psoas  INTERVENTIONS THIS SESSION: Therex: Educated on and practiced hip EXT and ABD to improve balance of musculature around the pelvis and prevent return of spasms and pain.  Manual: performed TP release and STM to B psoas to decrease pain and spasm and improve hip ext. ROM and improve balance of musculature surrounding the pelvis to allow for improved PFM relaxation. Dry needle: Performed TPDN with .30x1875mm needle  to B psoas to decrease pain and spasm and improve hip ext. ROM and improve balance of musculature surrounding the pelvis to allow for improved PFM relaxation.  Total time: 60 min.                    Trigger Point Dry Needling - 01/09/18 1251    Consent Given?  Yes    Education Handout Provided  No    Muscles Treated Upper Body  Iliopsoas   B Iliacus          PT Education - 01/09/18 1255    Education Details  See Pt. Instructions and Interventions this session.    Person(s) Educated  Patient    Methods  Explanation;Demonstration;Tactile cues;Verbal cues;Handout    Comprehension  Verbalized understanding;Returned demonstration;Verbal cues required;Tactile cues required       PT Short Term Goals - 10/17/17 1139      PT SHORT TERM GOAL #1   Title  Patient will demonstrate improved sitting and standing posture to demonstrate learning and decrease stress on the pelvic floor with functional activity.    Baseline  Anterior pelvic tilt    Time  6    Period  Weeks    Status  New    Target Date  11/28/17      PT SHORT TERM GOAL #2   Title  Patient will demonstrate improved pelvic alignment and balance of musculature surrounding the pelvis to facilitate decreased PFM spasms and decrease pelvic pain.    Baseline  L up-slip, R leg-long    Time  6    Period  Weeks    Status  New    Target Date  11/28/17      PT SHORT TERM GOAL #3   Title  Patient will report a reduction in pain to no greater than 1/10 over the prior week to demonstrate symptom improvement.    Baseline  3/10 over past week (debilitating during infection)    Time  6    Period  Weeks    Status  New    Target Date  11/28/17        PT Long Term Goals - 10/17/17 1141      PT LONG TERM GOAL #1   Title  Patient will report no pain with intercourse or other vaginal penetration to demonstrate improved functional ability.    Baseline  Pain with initial penetration    Time  12    Period  Weeks     Target Date  01/09/18      PT LONG TERM GOAL #2   Title  Patient will score less than or equal to 15% on the Female NIH-CPSI to demonstrate a reduction in pain, urinary symptoms, and an improved quality of life.    Baseline  Female NIH-CPSI: 14/43 (33%),  VQ: 11/33 (33%)      PT LONG TERM GOAL #3   Title  Patient will be independent with HEP and stress-management routines in order to return to PLOF.    Time  12    Period  Weeks    Status  New    Target Date  01/09/18      PT LONG TERM GOAL #4   Title  Patient will describe pain no greater than 0/10 during normal daily activities to demonstrate improved functional ability.    Baseline  3/10 max    Time  12    Period  Weeks    Status  New    Target Date  01/09/18            Plan - 01/09/18 1257    Clinical Impression Statement  Pt. responded well to all interventions today, demonstrating decreased spasms and pain following treatment and understanding of all education and new exercises given. Continue per POC.    Clinical Presentation  Stable    Clinical Decision Making  Moderate    Rehab Potential  Good    PT Frequency  1x / week    PT Duration  12 weeks    PT Treatment/Interventions  ADLs/Self Care Home Management;Biofeedback;Electrical Stimulation;Moist Heat;Functional mobility training;Therapeutic activities;Therapeutic exercise;Balance training;Patient/family education;Neuromuscular re-education;Manual techniques;Passive range of motion;Taping;Dry needling;Spinal Manipulations;Joint Manipulations    PT Next Visit Plan  Perform further DN to glutes and mobs to hips and spine for improved alignment. edducated on possible spondy Incorporate posterior hip strengthening.    PT Home Exercise Plan  Bird-dog, piriformis stretch, child's pose, kegels, diaphragmatic breathing, hip-flexor stretch, side-stretch, bow-and-arrow, side-plank, knee plank, hip ext and ABD.    Consulted and Agree with Plan of Care  Patient       Patient will  benefit from skilled therapeutic intervention in order to improve the following deficits and impairments:  Increased fascial restricitons, Improper body mechanics, Pain, Hypermobility, Increased muscle spasms, Postural dysfunction, Decreased activity tolerance, Decreased range of motion, Decreased balance  Visit Diagnosis: Other muscle spasm  Abnormal posture  Myalgia     Problem List Patient Active Problem List   Diagnosis Date Noted  . Tachycardia   . Dysmenorrhea   . Vulvodynia   . Hypertension 08/26/2016   Cleophus Molt DPT, ATC Cleophus Molt 01/09/2018, 1:00 PM   [facility] MAIN [facility] SERVICES [address] [place], [place], [address] Phone: [phone]   Fax:  [phone]  Name: ARRION BURRUEL MRN: [id number] Date of Birth: October 02, 1985

## 2018-01-16 ENCOUNTER — Ambulatory Visit: Payer: BLUE CROSS/BLUE SHIELD

## 2018-01-16 DIAGNOSIS — M62838 Other muscle spasm: Secondary | ICD-10-CM

## 2018-01-16 DIAGNOSIS — M791 Myalgia, unspecified site: Secondary | ICD-10-CM

## 2018-01-16 DIAGNOSIS — R293 Abnormal posture: Secondary | ICD-10-CM

## 2018-01-16 NOTE — Therapy (Signed)
[place] MAIN [facility] SERVICES [address] [place], [place], [address] Phone: [phone]   Fax:  [phone]  Physical Therapy Treatment  Patient Details  Name: Carrie Kennedy MRN: [id number] Date of Birth: Jun 28, 1985 Referring Provider (PT): Hal Neer   Encounter Date: 01/16/2018  PT End of Session - 01/16/18 0909    Visit Number  7    Number of Visits  13    Date for PT Re-Evaluation  01/09/18    PT Start Time  0805    PT Stop Time  0905    PT Time Calculation (min)  60 min    Activity Tolerance  Patient tolerated treatment well    Behavior During Therapy  [facility] for tasks assessed/performed       Past Medical History:  Diagnosis Date  . Dysmenorrhea   . Hyperlipidemia   . Hypertension   . Tachycardia   . Vulvodynia     Past Surgical History:  Procedure Laterality Date  . EAR      TUBES    There were no vitals filed for this visit.   Pelvic Floor Physical Therapy Treatment Note  SCREENING  Changes in medications, allergies, or medical history?: no    SUBJECTIVE  Patient reports: Has not been having the "knawing soreness in the low back but has had occasional mid-back sharp twinges. Occasionally feels tightening of PFM but it does not cause pain.  Pain update:  Location of pain: low back Current pain:  1/10  Max pain:  3/10 Least pain:  0/10 Nature of pain: achy  Patient Goals: To decrease tightening in the PFM and loosen up.   OBJECTIVE  Changes in: Posture/Observations:  Anterior pelvic tilt and L up-slip pre-treatment, aligned with mild anterior pelvic tilt remaining following treatment.  Palpation: TTP to L QL, obliques and B Iliacus   INTERVENTIONS THIS SESSION: Manual: Performed TP release to L QL, obliques and B Iliacus and L up-slip correction to improve alignment and LBP.    Total time: 60 min.                             PT Short Term Goals - 01/16/18  0813      PT SHORT TERM GOAL #1   Title  Patient will demonstrate improved sitting and standing posture to demonstrate learning and decrease stress on the pelvic floor with functional activity.  (Pended)     Baseline  Anterior pelvic ,   (Pended)     Time  6  (Pended)     Period  Weeks  (Pended)     Status  Partially Met  (Pended)     Target Date  11/28/17  (Pended)       PT SHORT TERM GOAL #2   Title  Patient will demonstrate improved pelvic alignment and balance of musculature surrounding the pelvis to facilitate decreased PFM spasms and decrease pelvic pain.  (Pended)     Baseline  L up-slip, R leg-long  (Pended)     Time  6  (Pended)     Period  Weeks  (Pended)     Status  New  (Pended)     Target Date  11/28/17  (Pended)       PT SHORT TERM GOAL #3   Title  Patient will report a reduction in pain to no greater than 1/10 over the prior week to demonstrate symptom improvement.  (Pended)  Baseline  3/10 over past week (debilitating during infection)  (Pended)     Time  6  (Pended)     Period  Weeks  (Pended)     Status  New  (Pended)     Target Date  11/28/17  (Pended)         PT Long Term Goals - 01/16/18 0936      PT LONG TERM GOAL #1   Title  Patient will report no pain with intercourse or other vaginal penetration to demonstrate improved functional ability.    Baseline  Pain with initial penetration    Time  12    Period  Weeks    Status  On-going    Target Date  02/27/18      PT LONG TERM GOAL #2   Title  Patient will score less than or equal to 15% on the Female NIH-CPSI to demonstrate a reduction in pain, urinary symptoms, and an improved quality of life.    Baseline  Female NIH-CPSI: 14/43 (33%),  VQ: 11/33 (33%)    Time  12    Period  Weeks    Status  On-going    Target Date  02/27/18      PT LONG TERM GOAL #3   Title  Patient will be independent with HEP and stress-management routines in order to return to PLOF.    Time  12    Period  Weeks    Status   On-going    Target Date  02/27/18      PT LONG TERM GOAL #4   Title  Patient will describe pain no greater than 0/10 during normal daily activities to demonstrate improved functional ability.    Baseline  3/10 max    Time  12    Period  Weeks    Status  On-going    Target Date  02/27/18            Plan - 01/16/18 0910    Clinical Impression Statement  Pt. demonstrated pelvic alignment following treatment today and decreased pain following treatment as well as decreased spasm. She has met all short term goals and has made progress toward her long-term goals, demonstrating decreased frequency of pelvic floor tightness and LBP. She will benefit from 6 more visits at 1x/week to continue to progress toward her long term goals and build up her HEP to allow for long-term maintenence of relief.     Clinical Presentation  Stable    Clinical Decision Making  Moderate    Rehab Potential  Good    PT Frequency  1x / week    PT Duration  6 weeks    PT Treatment/Interventions  ADLs/Self Care Home Management;Biofeedback;Electrical Stimulation;Moist Heat;Functional mobility training;Therapeutic activities;Therapeutic exercise;Balance training;Patient/family education;Neuromuscular re-education;Manual techniques;Passive range of motion;Taping;Dry needling;Spinal Manipulations;Joint Manipulations    PT Next Visit Plan  Perform further DN to glutes and hip flexors and spine for improved alignment. edducated on possible spondy Incorporate posterior hip strengthening.    PT Home Exercise Plan  Bird-dog, piriformis stretch, child's pose, kegels, diaphragmatic breathing, hip-flexor stretch, side-stretch, bow-and-arrow, side-plank, knee plank, hip ext and ABD.    Consulted and Agree with Plan of Care  Patient       Patient will benefit from skilled therapeutic intervention in order to improve the following deficits and impairments:  Increased fascial restricitons, Improper body mechanics, Pain, Hypermobility,  Increased muscle spasms, Postural dysfunction, Decreased activity tolerance, Decreased range of motion, Decreased balance  Visit Diagnosis:  Other muscle spasm  Abnormal posture  Myalgia     Problem List Patient Active Problem List   Diagnosis Date Noted  . Tachycardia   . Dysmenorrhea   . Vulvodynia   . Hypertension 08/26/2016   Willa Rough DPT, ATC Willa Rough 01/16/2018, 9:45 AM  [place] MAIN [facility] SERVICES [address] [place], [place], [address] Phone: [phone]   Fax:  [phone]  Name: MERCEDES FORT MRN: [id number] Date of Birth: 1985/07/14

## 2018-01-23 ENCOUNTER — Ambulatory Visit: Payer: BLUE CROSS/BLUE SHIELD

## 2018-01-23 DIAGNOSIS — M62838 Other muscle spasm: Secondary | ICD-10-CM

## 2018-01-23 DIAGNOSIS — R293 Abnormal posture: Secondary | ICD-10-CM

## 2018-01-23 DIAGNOSIS — M791 Myalgia, unspecified site: Secondary | ICD-10-CM

## 2018-01-23 NOTE — Therapy (Signed)
[place] MAIN [facility] SERVICES [address] [place], [place], [address] Phone: [phone]   Fax:  [phone]  Physical Therapy Treatment  Patient Details  Name: Carrie Kennedy MRN: [id number] Date of Birth: August 10, 1985 Referring Provider (PT): Hal Neer   Encounter Date: 01/23/2018  PT End of Session - 01/23/18 0909    Visit Number  8    Number of Visits  13    Date for PT Re-Evaluation  01/09/18    PT Start Time  0812    PT Stop Time  0912    PT Time Calculation (min)  60 min    Activity Tolerance  Patient tolerated treatment well    Behavior During Therapy  [facility] for tasks assessed/performed       Past Medical History:  Diagnosis Date  . Dysmenorrhea   . Hyperlipidemia   . Hypertension   . Tachycardia   . Vulvodynia     Past Surgical History:  Procedure Laterality Date  . EAR      TUBES    There were no vitals filed for this visit.    Pelvic Floor Physical Therapy Treatment Note  SCREENING  Changes in medications, allergies, or medical history?: no    SUBJECTIVE  Patient reports: She has not been having the constant back pain pelvic floor is "better" not all the way there yet.   Pain update:  Location of pain: low back Current pain:  0/10  Max pain:  2/10 Least pain:  0/10 Nature of pain: ache  Patient Goals: To decrease tightening in the PFM and loosen up.   OBJECTIVE  Changes in: Posture/Observations:  Very slightly higher on L PSIS   Range of Motion/Flexibilty:  Decreased hip EXT B.  Palpation: TTP to B Iliacus  INTERVENTIONS THIS SESSION: Manual: Performed STM and TP release to B Iliacus to decrease pain and spasm and allow for decreased pressure on the nerves as they course to the pelvis for decreased pain and PFM relaxation. Dry needle: Performed TPDN with .30x78m needle to B Iliacus to decrease pain and spasm and allow for decreased pressure on the nerves as they course to the  pelvis for decreased pain and PFM relaxation.  Total time: 60 min.                          PT Education - 01/23/18 0908    Education Details  Pt reviewed on how each of her HEP exercises is important to resolve her symptoms and how to keep up with them     Person(s) Educated  Patient    Methods  Explanation    Comprehension  Verbalized understanding       PT Short Term Goals - 01/16/18 0813      PT SHORT TERM GOAL #1   Title  Patient will demonstrate improved sitting and standing posture to demonstrate learning and decrease stress on the pelvic floor with functional activity.  (Pended)     Baseline  Anterior pelvic ,   (Pended)     Time  6  (Pended)     Period  Weeks  (Pended)     Status  Partially Met  (Pended)     Target Date  11/28/17  (Pended)       PT SHORT TERM GOAL #2   Title  Patient will demonstrate improved pelvic alignment and balance of musculature surrounding the pelvis to facilitate decreased PFM spasms and decrease  pelvic pain.  (Pended)     Baseline  L up-slip, R leg-long  (Pended)     Time  6  (Pended)     Period  Weeks  (Pended)     Status  New  (Pended)     Target Date  11/28/17  (Pended)       PT SHORT TERM GOAL #3   Title  Patient will report a reduction in pain to no greater than 1/10 over the prior week to demonstrate symptom improvement.  (Pended)     Baseline  3/10 over past week (debilitating during infection)  (Pended)     Time  6  (Pended)     Period  Weeks  (Pended)     Status  New  (Pended)     Target Date  11/28/17  (Pended)         PT Long Term Goals - 01/16/18 0936      PT LONG TERM GOAL #1   Title  Patient will report no pain with intercourse or other vaginal penetration to demonstrate improved functional ability.    Baseline  Pain with initial penetration    Time  12    Period  Weeks    Status  On-going    Target Date  02/27/18      PT LONG TERM GOAL #2   Title  Patient will score less than or equal to 15%  on the Female NIH-CPSI to demonstrate a reduction in pain, urinary symptoms, and an improved quality of life.    Baseline  Female NIH-CPSI: 14/43 (33%),  VQ: 11/33 (33%)    Time  12    Period  Weeks    Status  On-going    Target Date  02/27/18      PT LONG TERM GOAL #3   Title  Patient will be independent with HEP and stress-management routines in order to return to PLOF.    Time  12    Period  Weeks    Status  On-going    Target Date  02/27/18      PT LONG TERM GOAL #4   Title  Patient will describe pain no greater than 0/10 during normal daily activities to demonstrate improved functional ability.    Baseline  3/10 max    Time  12    Period  Weeks    Status  On-going    Target Date  02/27/18            Plan - 01/23/18 0909    Clinical Impression Statement  Pt. demonstrated significant releaase of hip-flexors and improved hip ext ROM following treatment today. Continue per POC.    Clinical Presentation  Stable    Clinical Decision Making  Moderate    Rehab Potential  Good    PT Frequency  1x / week    PT Duration  6 weeks    PT Treatment/Interventions  ADLs/Self Care Home Management;Biofeedback;Electrical Stimulation;Moist Heat;Functional mobility training;Therapeutic activities;Therapeutic exercise;Balance training;Patient/family education;Neuromuscular re-education;Manual techniques;Passive range of motion;Taping;Dry needling;Spinal Manipulations;Joint Manipulations    PT Next Visit Plan  Internal TP release    PT Home Exercise Plan  Bird-dog, piriformis stretch, child's pose, kegels, diaphragmatic breathing, hip-flexor stretch, side-stretch, bow-and-arrow, side-plank, knee plank, hip ext and ABD.    Consulted and Agree with Plan of Care  Patient       Patient will benefit from skilled therapeutic intervention in order to improve the following deficits and impairments:  Increased fascial restricitons, Improper body  mechanics, Pain, Hypermobility, Increased muscle spasms,  Postural dysfunction, Decreased activity tolerance, Decreased range of motion, Decreased balance  Visit Diagnosis: Other muscle spasm  Abnormal posture  Myalgia     Problem List Patient Active Problem List   Diagnosis Date Noted  . Tachycardia   . Dysmenorrhea   . Vulvodynia   . Hypertension 08/26/2016   Willa Rough DPT, ATC Willa Rough 01/23/2018, 9:11 AM  [place] MAIN [facility] SERVICES [address] [place], [place], [address] Phone: [phone]   Fax:  [phone]  Name: LAKEETA DOBOSZ MRN: [id number] Date of Birth: Aug 27, 1985

## 2018-01-30 ENCOUNTER — Ambulatory Visit: Payer: BLUE CROSS/BLUE SHIELD

## 2018-02-20 ENCOUNTER — Ambulatory Visit: Payer: 59 | Attending: Obstetrics & Gynecology

## 2018-02-20 DIAGNOSIS — M791 Myalgia, unspecified site: Secondary | ICD-10-CM

## 2018-02-20 DIAGNOSIS — R293 Abnormal posture: Secondary | ICD-10-CM

## 2018-02-20 DIAGNOSIS — M62838 Other muscle spasm: Secondary | ICD-10-CM | POA: Diagnosis not present

## 2018-02-20 NOTE — Therapy (Signed)
[place] MAIN [facility] SERVICES [address] [place], [place], [address] Phone: [phone]   Fax:  [phone]  Physical Therapy Treatment  Patient Details  Name: Carrie Kennedy MRN: [id number] Date of Birth: 1985-04-19 Referring Provider (PT): Hal Neer   Encounter Date: 02/20/2018  PT End of Session - 02/20/18 0941    Visit Number  9    Number of Visits  18    Date for PT Re-Evaluation  02/27/18    Authorization - Visit Number  3    Authorization - Number of Visits  6    PT Start Time  0804    PT Stop Time  0904    PT Time Calculation (min)  60 min    Activity Tolerance  Patient tolerated treatment well    Behavior During Therapy  [facility] for tasks assessed/performed       Past Medical History:  Diagnosis Date  . Dysmenorrhea   . Hyperlipidemia   . Hypertension   . Tachycardia   . Vulvodynia     Past Surgical History:  Procedure Laterality Date  . EAR      TUBES    There were no vitals filed for this visit.    Pelvic Floor Physical Therapy Treatment Note  SCREENING  Changes in medications, allergies, or medical history?: Changes in her anxiety medicine, decreasing nortriptyline and increasing cymbalta.     SUBJECTIVE  Patient reports: She started having pelvic pain on Jan 6th following taking an antibiotic for an upper respiratory infection. The pain was intense but has not gone all the way up to the back.   Pain update:  Location of pain: B hips and lower pelvis Current pain:  3/10  Max pain:  10/10 Least pain:  2/10 Nature of pain: achy and sharp  Patient Goals: To decrease tightening in the PFM and loosen up.   OBJECTIVE  Changes in: Posture/Observations:  Hips level but anterior pelvic tilt present  Palpation: TTP through L>R Iliacus  INTERVENTIONS THIS SESSION: Manual: Performed TP release and STM to  B Iliacus with .30x44m needle to decrease spasm and pain and allow for decreased tension  on the nerves that innervate the PFM.  Dry-needle: Performed TPDN to B Iliacus with .30x930mneedle to decrease spasm and pain  and allow for decreased tension on the nerves that innervate the PFM.   Therex: reviewed kneeling hip flexor stretch and educated on sitting and standing version to allow for more frequent performance throughout the day. Reviewed how hip extension decreases tension in the hip-flexors and allows for decreased pain, not to avoid exercises when she is flared up. Educated on neural flossing exercise to decrease tingling in her R foot  From neural tension.  Total time: 60 min.                          PT Education - 02/20/18 0939    Education Details  See Pt. instructions and interventons this session    Person(s) Educated  Patient    Methods  Explanation;Demonstration;Verbal cues;Handout    Comprehension  Verbalized understanding;Verbal cues required       PT Short Term Goals - 01/16/18 0813      PT SHORT TERM GOAL #1   Title  Patient will demonstrate improved sitting and standing posture to demonstrate learning and decrease stress on the pelvic floor with functional activity.  (Pended)     Baseline  Anterior pelvic ,   (  Pended)     Time  6  (Pended)     Period  Weeks  (Pended)     Status  Partially Met  (Pended)     Target Date  11/28/17  (Pended)       PT SHORT TERM GOAL #2   Title  Patient will demonstrate improved pelvic alignment and balance of musculature surrounding the pelvis to facilitate decreased PFM spasms and decrease pelvic pain.  (Pended)     Baseline  L up-slip, R leg-long  (Pended)     Time  6  (Pended)     Period  Weeks  (Pended)     Status  New  (Pended)     Target Date  11/28/17  (Pended)       PT SHORT TERM GOAL #3   Title  Patient will report a reduction in pain to no greater than 1/10 over the prior week to demonstrate symptom improvement.  (Pended)     Baseline  3/10 over past week (debilitating during infection)   (Pended)     Time  6  (Pended)     Period  Weeks  (Pended)     Status  New  (Pended)     Target Date  11/28/17  (Pended)         PT Long Term Goals - 01/16/18 0936      PT LONG TERM GOAL #1   Title  Patient will report no pain with intercourse or other vaginal penetration to demonstrate improved functional ability.    Baseline  Pain with initial penetration    Time  12    Period  Weeks    Status  On-going    Target Date  02/27/18      PT LONG TERM GOAL #2   Title  Patient will score less than or equal to 15% on the Female NIH-CPSI to demonstrate a reduction in pain, urinary symptoms, and an improved quality of life.    Baseline  Female NIH-CPSI: 14/43 (33%),  VQ: 11/33 (33%)    Time  12    Period  Weeks    Status  On-going    Target Date  02/27/18      PT LONG TERM GOAL #3   Title  Patient will be independent with HEP and stress-management routines in order to return to PLOF.    Time  12    Period  Weeks    Status  On-going    Target Date  02/27/18      PT LONG TERM GOAL #4   Title  Patient will describe pain no greater than 0/10 during normal daily activities to demonstrate improved functional ability.    Baseline  3/10 max    Time  12    Period  Weeks    Status  On-going    Target Date  02/27/18            Plan - 02/20/18 0946    Clinical Impression Statement  Pt. had a pain flare-up due to getting an upper respiriatory infection, changes in her medications, and decreasing her activity/not performing her exercises. Her hip-flexor muscles tightened back doen and are pulling her into an anterior pelvic tilt but she responded well to treatment today, demonstrating improved ROM, decreased spasm, and understanding of exercises as well as how they affect her pain. Continue per POC.    Clinical Presentation  Stable    Clinical Decision Making  Moderate    Rehab Potential  Good    PT Frequency  1x / week    PT Duration  6 weeks    PT Treatment/Interventions   ADLs/Self Care Home Management;Biofeedback;Electrical Stimulation;Moist Heat;Functional mobility training;Therapeutic activities;Therapeutic exercise;Balance training;Patient/family education;Neuromuscular re-education;Manual techniques;Passive range of motion;Taping;Dry needling;Spinal Manipulations;Joint Manipulations    PT Next Visit Plan  Internal TP release and/or work to decrease hip-flexor and low back spasm/tension, posterior hip strengthening.    PT Home Exercise Plan  Bird-dog, piriformis stretch, child's pose, kegels, diaphragmatic breathing, hip-flexor stretch, side-stretch, bow-and-arrow, side-plank, knee plank, hip ext and ABD, seated and standing hip-flexor stretch, neural flossing for RLE.    Consulted and Agree with Plan of Care  Patient       Patient will benefit from skilled therapeutic intervention in order to improve the following deficits and impairments:  Increased fascial restricitons, Improper body mechanics, Pain, Hypermobility, Increased muscle spasms, Postural dysfunction, Decreased activity tolerance, Decreased range of motion, Decreased balance  Visit Diagnosis: Other muscle spasm  Abnormal posture  Myalgia     Problem List Patient Active Problem List   Diagnosis Date Noted  . Tachycardia   . Dysmenorrhea   . Vulvodynia   . Hypertension 08/26/2016   Willa Rough DPT, ATC Willa Rough 02/20/2018, 9:50 AM  [place] MAIN [facility] SERVICES [address] [place], [place], [address] Phone: [phone]   Fax:  [phone]  Name: Carrie Kennedy MRN: [id number] Date of Birth: 04/04/1985

## 2018-02-20 NOTE — Patient Instructions (Signed)
SLUMP SURAL NERVE: Flossing II    Slouched, hands behind back, chin tucked, toes pulled up and in. Simultaneously straighten knee and look up. Do _2__ sets of _15__ repetitions per session. Do _1-2__ sessions per day.  Flexors, Standing: Advanced    Stand on one leg, other leg behind on chair. Slowly drop pelvis until stretch is felt on front of hip on supported side. Hold for 5 deep breaths and repeat 2-3 times

## 2018-02-23 NOTE — Addendum Note (Signed)
Addended by: Flora Lipps T on: 02/23/2018 01:06 PM   Modules accepted: Orders

## 2018-02-27 ENCOUNTER — Ambulatory Visit: Payer: 59 | Admitting: Physical Therapy

## 2018-02-27 DIAGNOSIS — M791 Myalgia, unspecified site: Secondary | ICD-10-CM

## 2018-02-27 DIAGNOSIS — M62838 Other muscle spasm: Secondary | ICD-10-CM

## 2018-02-27 DIAGNOSIS — R293 Abnormal posture: Secondary | ICD-10-CM

## 2018-02-27 NOTE — Patient Instructions (Signed)
Midback scoliosis specific stretches and strengthening:   R side:  1) Open book lying on L, rotating 3/4 onto pilllow to R 10 reps, pillow between knees ( handout)   2 a) In 3/4 turn of trunk, resting onto pillow, Pull mattress with R , elbow away from head to lengthen R side ribs 5 breaths 2b)  standing version:  Scoliosis "pat your back - holding towel"     "pat your self on the back" with R hand, holding strap/ scarf , elbow up pulling strap while R hand grabs strap behind back,  Keep chin tuck, Make sure not to over arch low back   5 breaths   3)  Knees bent, both hands at counter, pulling butt back to lengthen spine R hand on L thigh,  inhale lengthen, exhale twist at midback , looking under L armpit  3 breaths x 3      L side:   4) On back: Angel wings elbow bent, dragging on bed 10 reps to move shoulder    ____  Strengthening shoulder blades and head ontop of trunk ( to correct forward head)  5a) lying on your back  In bed 5 sec  Holds x 10 reps   5b standing version) Wall squats: Feet two feet away from the wall Keep back of hips/ shoulders by the wall Inhale, lower hips tot he point where knees above ankles, and not any further down where you can not see your toes  Exhale, rise up keeping back of hips and shoulders , back of the head, chin tucked against wall   10 reps  X 3       ____

## 2018-02-27 NOTE — Therapy (Signed)
[place] MAIN [facility] SERVICES [address] [place], [place], [address] Phone: [phone]   Fax:  [phone]  Physical Therapy Treatment / Progress Note  Reporting  10/18/18 to  02/27/18  Patient Details  Name: Carrie Kennedy MRN: [id number] Date of Birth: Mar 02, 1985 Referring Provider (PT): Hal Neer   Encounter Date: 02/27/2018  PT End of Session - 02/27/18 1841    Visit Number  10    Number of Visits  20    Date for PT Re-Evaluation  05/22/18    Authorization - Visit Number  4    Authorization - Number of Visits  6    PT Start Time  0810    PT Stop Time  0905    PT Time Calculation (min)  55 min    Activity Tolerance  Patient tolerated treatment well    Behavior During Therapy  [facility] for tasks assessed/performed       Past Medical History:  Diagnosis Date  . Dysmenorrhea   . Hyperlipidemia   . Hypertension   . Tachycardia   . Vulvodynia     Past Surgical History:  Procedure Laterality Date  . EAR      TUBES    There were no vitals filed for this visit.  Subjective Assessment - 02/27/18 0812    Subjective  Pt reported she had little flareup after last session with the dry needling which lasted about 3 days. Pt feels fine today. 1-1.5/10.  Pt started her period yesterday and noted her burning pelvic pain is not there today.           [facility] PT Assessment - 02/27/18 0815      Palpation   Spinal mobility  convex curves: thoracic R/ lumbar L,  L shoulder lower than R . Noted tight iliocostalis, interspinal mm R T3-12 , hypomobility at upper T segments, R upper trap/scalenes/ levator scapula R tightness, L medial to scapular tightness  ( Post Tx: improved mm pliability, and shoulder height more equal)         SI assessment   iliac crest levelled     Palpation comment  R QL more tight                    [facility] Adult PT Treatment/Exercise - 02/27/18 6415      Neuro Re-ed    Neuro Re-ed Details   cued for  cervical/scap retraction .  modified one HEP to minimize radicular Sx.  New HEP did not produce c5-6 radicular Sx    See HEP     Moist Heat Therapy   Number Minutes Moist Heat  6 Minutes    Moist Heat Location  --   throacic/ cervical with new HEP      Manual Therapy   Manual therapy comments  Distraction at upper T segments, STM/ MWM at convex curves: thoracic R/ lumbar L,  L shoulder lower than R . Noted tight iliocostalis, interspinal mm R T3-12 , hypomobility at upper T segments, R upper trap/scalenes/ levator scapula R tightness, L medial to scapular tightness                PT Short Term Goals - 01/16/18 0813      PT SHORT TERM GOAL #1   Title  Patient will demonstrate improved sitting and standing posture to demonstrate learning and decrease stress on the pelvic floor with functional activity.  (Pended)     Baseline  Anterior pelvic ,  Time  6  (Pended)     Period  Weeks  (Pended)     Status  Partially Met  (Pended)     Target Date  11/28/17  (Pended)       PT SHORT TERM GOAL #2   Title  Patient will demonstrate improved pelvic alignment and balance of musculature surrounding the pelvis to facilitate decreased PFM spasms and decrease pelvic pain.  (Pended)     Baseline  L up-slip, R leg-long  (Pended)     Time  6  (Pended)     Period  Weeks  (Pended)     Status  New  (Pended)     Target Date  11/28/17  (Pended)       PT SHORT TERM GOAL #3   Title  Patient will report a reduction in pain to no greater than 1/10 over the prior week to demonstrate symptom improvement.  (Pended)     Baseline  3/10 over past week (debilitating during infection)  (Pended)     Time  6  (Pended)     Period  Weeks  (Pended)     Status  New  (Pended)     Target Date  11/28/17  (Pended)         PT Long Term Goals - 02/27/18 1844      PT LONG TERM GOAL #1   Title  Patient will report no pain with intercourse or other vaginal penetration to demonstrate improved functional ability.     Baseline  Pain with initial penetration    Time  12    Period  Weeks    Status  On-going      PT LONG TERM GOAL #2   Title  Patient will score less than or equal to 15% on the Female NIH-CPSI to demonstrate a reduction in pain, urinary symptoms, and an improved quality of life.    Baseline  Female NIH-CPSI: 14/43 (33%),  VQ: 11/33 (33%)    Time  12    Period  Weeks    Status  On-going      PT LONG TERM GOAL #3   Title  Patient will be independent with HEP and stress-management routines in order to return to PLOF.    Time  12    Period  Weeks    Status  On-going      PT LONG TERM GOAL #4   Title  Patient will describe pain no greater than 0/10 during normal daily activities to demonstrate improved functional ability.    Baseline  3/10 max    Time  12    Period  Weeks    Status  On-going            Plan - 02/27/18 1842    Clinical Impression Statement  Pt continues to progress towards her goals. Pt 's LBP pain is improving and pelvic pain will continue to improve has her scoliosis gets addressed. Pt is maintaining equal alignment of pelvis with compliant to HEP. Today, her thoracolumbar curves 2/2 scoliosis was addressed with manual Tx and HEP.  Pt demo'd decreased R thoracic mm tightness, increased thoracic mobility with rotation, and decreased R shoulder tightness post Tx which will help with shoulder height difference. Anticipate as her spinal mm imbalances improve with postural stability strengthening, pt's pelvic pain will have longer lasting improvements. Pt continues to benefit from skilled PT.     Rehab Potential  Good    PT Frequency  1x / week  PT Duration  6 weeks    PT Treatment/Interventions  ADLs/Self Care Home Management;Biofeedback;Electrical Stimulation;Moist Heat;Functional mobility training;Therapeutic activities;Therapeutic exercise;Balance training;Patient/family education;Neuromuscular re-education;Manual techniques;Passive range of motion;Taping;Dry  needling;Spinal Manipulations;Joint Manipulations    PT Next Visit Plan  Internal TP release and/or work to decrease hip-flexor and low back spasm/tension, posterior hip strengthening.    PT Home Exercise Plan  Bird-dog, piriformis stretch, child's pose, kegels, diaphragmatic breathing, hip-flexor stretch, side-stretch, bow-and-arrow, side-plank, knee plank, hip ext and ABD, seated and standing hip-flexor stretch, neural flossing for RLE.    Consulted and Agree with Plan of Care  Patient       Patient will benefit from skilled therapeutic intervention in order to improve the following deficits and impairments:  Increased fascial restricitons, Improper body mechanics, Pain, Hypermobility, Increased muscle spasms, Postural dysfunction, Decreased activity tolerance, Decreased range of motion, Decreased balance  Visit Diagnosis: Other muscle spasm  Abnormal posture  Myalgia     Problem List Patient Active Problem List   Diagnosis Date Noted  . Tachycardia   . Dysmenorrhea   . Vulvodynia   . Hypertension 08/26/2016    Jerl Mina ,PT, DPT, E-RYT  02/27/2018, 6:52 PM  [place] MAIN [facility] SERVICES [address] [place], [place], [address] Phone: [phone]   Fax:  [phone]  Name: Carrie Kennedy MRN: [id number] Date of Birth: 1985/12/09

## 2018-03-06 ENCOUNTER — Ambulatory Visit: Payer: 59

## 2018-03-06 DIAGNOSIS — M62838 Other muscle spasm: Secondary | ICD-10-CM | POA: Diagnosis not present

## 2018-03-06 DIAGNOSIS — R293 Abnormal posture: Secondary | ICD-10-CM

## 2018-03-06 DIAGNOSIS — M791 Myalgia, unspecified site: Secondary | ICD-10-CM

## 2018-03-06 NOTE — Therapy (Signed)
[place] [facility] MAIN [facility] SERVICES [address] [place], [place], [address] Phone: [phone]   Fax:  [phone]  Physical Therapy Treatment  Patient Details  Name: Carrie Kennedy MRN: [id number] Date of Birth: 1985/06/04 Referring Provider (PT): Eligha Bridegroom   Encounter Date: 03/06/2018  PT End of Session - 03/06/18 0927    Visit Number  11    Number of Visits  20    Date for PT Re-Evaluation  05/22/18    Authorization - Visit Number  5    Authorization - Number of Visits  6    PT Start Time  0810    PT Stop Time  0915    PT Time Calculation (min)  65 min    Activity Tolerance  Patient tolerated treatment well    Behavior During Therapy  [facility] for tasks assessed/performed       Past Medical History:  Diagnosis Date  . Dysmenorrhea   . Hyperlipidemia   . Hypertension   . Tachycardia   . Vulvodynia     Past Surgical History:  Procedure Laterality Date  . EAR      TUBES    There were no vitals filed for this visit.    Pelvic Floor Physical Therapy Treatment Note  SCREENING  Changes in medications, allergies, or medical history?: no    SUBJECTIVE  Patient reports: Is doing well, has has a little soreness but not too much over past week. Has been doing her exercises fairly regularly, not as good with the side plank.   Pain update:  Location of pain: B hips Current pain:  0/10  Max pain:  2/10 Least pain:  0/10 Nature of pain: soreness  Patient Goals: To decrease tightening in the PFM and loosen up.   OBJECTIVE  Changes in: Posture/Observations:  Forward head position with wall squat and chest press, Pt. Able to correct only by ~ 50% due to decreased ROM availability.  Range of Motion/Flexibilty:  Pt. Able to better attain deep-core recruitment intentionally and self-corrects ~ 50% of the time.  Strength/MMT:   Pt. Has difficulty engaging glute med due to dominance of quads with lateral leg  raise.  Palpation: TTP through B pec major (superior bands) and SCM as well as sub-occipitals  INTERVENTIONS THIS SESSION: Manual: Performed TP release to B pec major (superior bands) and SCM as well as sub-occipitals to decrease spasm and improve postural alignment to decrease Sx. And allow for strengthening into optimal position for decreased return of spasm. Therex: Educated on and practiced monster-walks and wall-squat with a band around the knees to allow for improved strengthening of hip extensors and abductors to decrease tightness of adductors and hip-flexors for improved balance of mm surrounding the pelvis. Reviewed standing hip ABD and decided to replace for improved efficacy of. exercise.   Total time: 65 min.                          PT Education - 03/06/18 5573    Education Details  See Pt. Instructions and Interventions this session.    Person(s) Educated  Patient    Methods  Explanation;Demonstration;Tactile cues;Verbal cues;Handout    Comprehension  Verbalized understanding;Returned demonstration;Verbal cues required;Tactile cues required       PT Short Term Goals - 03/06/18 1215      PT SHORT TERM GOAL #1   Title  Patient will demonstrate improved sitting and standing posture to demonstrate learning and  decrease stress on the pelvic floor with functional activity.    Baseline  Anterior pelvic ,     Time  6    Period  Weeks    Status  On-going    Target Date  05/22/18      PT SHORT TERM GOAL #2   Title  Patient will demonstrate improved pelvic alignment and balance of musculature surrounding the pelvis to facilitate decreased PFM spasms and decrease pelvic pain.    Baseline  L up-slip, R leg-long    Time  6    Period  Weeks    Status  Achieved    Target Date  11/29/18      PT SHORT TERM GOAL #3   Title  Patient will report a reduction in pain to no greater than 1/10 over the prior week to demonstrate symptom improvement.    Baseline   3/10 over past week (debilitating during infection), As of 1-31: Max of 2/10    Time  6    Period  Weeks    Status  On-going    Target Date  05/22/18        PT Long Term Goals - 03/06/18 1217      PT LONG TERM GOAL #1   Title  Patient will report no pain with intercourse or other vaginal penetration to demonstrate improved functional ability.    Baseline  Pain with initial penetration    Time  12    Period  Weeks    Status  Unable to assess    Target Date  05/22/18      PT LONG TERM GOAL #2   Title  Patient will score less than or equal to 15% on the Female NIH-CPSI to demonstrate a reduction in pain, urinary symptoms, and an improved quality of life.    Baseline  Female NIH-CPSI: 14/43 (33%),  VQ: 11/33 (33%)    Time  12    Period  Weeks    Status  On-going    Target Date  05/22/18      PT LONG TERM GOAL #3   Title  Patient will be independent with HEP and stress-management routines in order to return to PLOF.    Time  12    Period  Weeks    Status  Achieved    Target Date  02/27/18      PT LONG TERM GOAL #4   Title  Patient will describe pain no greater than 0/10 during normal daily activities to demonstrate improved functional ability.    Baseline  3/10 max    Time  12    Period  Weeks    Status  On-going    Target Date  05/22/18            Plan - 03/06/18 1610    Clinical Impression Statement  Pt. responded well to all treatments today, demonstrating understanding and appropriate performance of new and existing exerccises as well as improved head and shoulder position and decreased spasms in the R shoulder and neck following treatment today. Continue per POC.    Clinical Presentation  Stable    Clinical Decision Making  Moderate    Rehab Potential  Good    PT Frequency  1x / week    PT Duration  12 weeks    PT Treatment/Interventions  ADLs/Self Care Home Management;Biofeedback;Electrical Stimulation;Moist Heat;Functional mobility training;Therapeutic  activities;Therapeutic exercise;Balance training;Patient/family education;Neuromuscular re-education;Manual techniques;Passive range of motion;Taping;Dry needling;Spinal Manipulations;Joint Manipulations    PT Next  Visit Plan  Internal TP release and/or work to decrease hip-flexor and low back spasm/tension, posterior hip strengthening.    PT Home Exercise Plan  Bird-dog, piriformis stretch, child's pose, kegels, diaphragmatic breathing, hip-flexor stretch, side-stretch, bow-and-arrow, side-plank, knee plank, hip ext, wall squats, monster walks, seated and standing hip-flexor stretch, neural flossing for RLE, chest-press to decrease rib-flare.Marland Kitchen.    Consulted and Agree with Plan of Care  Patient       Patient will benefit from skilled therapeutic intervention in order to improve the following deficits and impairments:  Increased fascial restricitons, Improper body mechanics, Pain, Hypermobility, Increased muscle spasms, Postural dysfunction, Decreased activity tolerance, Decreased range of motion, Decreased balance  Visit Diagnosis: Other muscle spasm  Abnormal posture  Myalgia     Problem List Patient Active Problem List   Diagnosis Date Noted  . Tachycardia   . Dysmenorrhea   . Vulvodynia   . Hypertension 08/26/2016   Cleophus MoltKeeli T. Kimyetta Flott DPT, ATC Cleophus MoltKeeli T Claudie Brickhouse 03/06/2018, 12:20 PM   [facility] MAIN [facility] [address] [place], [place], [address] Phone: [phone]   Fax:  [phone]  Name: Carrie Kennedy MRN: [id number] Date of Birth: February 04, 1986

## 2018-03-06 NOTE — Patient Instructions (Signed)
  Stay in mini-squat with your chest leaning forward to engage the glutes. Take small, controlled side-steps and try to minimize bobbing up and down, control as you bring the following leg toward the middle. You shoulde feel work int the "side-butt". Do 10 steps each direction, repeat 3 times, resting in-between.    Put band around mid back, exhale and push forward into the band as you draw your ribs together and flatten the mid back into the wall. Do 2x10

## 2018-03-13 ENCOUNTER — Ambulatory Visit: Payer: 59 | Attending: Obstetrics & Gynecology

## 2018-03-13 DIAGNOSIS — R293 Abnormal posture: Secondary | ICD-10-CM

## 2018-03-13 DIAGNOSIS — M62838 Other muscle spasm: Secondary | ICD-10-CM | POA: Insufficient documentation

## 2018-03-13 DIAGNOSIS — M791 Myalgia, unspecified site: Secondary | ICD-10-CM | POA: Diagnosis present

## 2018-03-13 NOTE — Therapy (Signed)
[place] [facility] MAIN [facility] SERVICES [address] [place], [place], [address] Phone: [phone]   Fax:  [phone]  Physical Therapy Treatment  Patient Details  Name: Carrie Kennedy MRN: [id number] Date of Birth: 12/09/1985 Referring Provider (PT): Eligha BridegroomMichelle Louis   Encounter Date: 03/13/2018  PT End of Session - 03/13/18 1239    Visit Number  12    Number of Visits  20    Date for PT Re-Evaluation  05/22/18    Authorization - Visit Number  2    Authorization - Number of Visits  12    PT Start Time  0804    PT Stop Time  0904    PT Time Calculation (min)  60 min    Activity Tolerance  Patient tolerated treatment well    Behavior During Therapy  [facility] for tasks assessed/performed       Past Medical History:  Diagnosis Date  . Dysmenorrhea   . Hyperlipidemia   . Hypertension   . Tachycardia   . Vulvodynia     Past Surgical History:  Procedure Laterality Date  . EAR      TUBES    There were no vitals filed for this visit.    Pelvic Floor Physical Therapy Treatment Note  SCREENING  Changes in medications, allergies, or medical history?: Has fully switched her Cymbalta/nortriptline for only 2 days now.    SUBJECTIVE  Patient reports: Feels "weird" not pain but not completely gone. Occasional "twinge" but no burning.  Pain update: "twinge" occasionally.  Patient Goals: To decrease tightening in the PFM and loosen up.   OBJECTIVE  Changes in: Posture/Observations:  Hyper kyphotic/lordotic.  Range of Motion/Flexibilty:  Pain and decreased mobility through thoracic spine, greatest at T2-3, 4-5, and 11-12. Also decreased mobility and pain with PA to R>L sacral borders. Improved mobility through all following treatment but some anterior flexion of the coccyx noted and not sufficient time to address during session.  Pelvic floor: TTP to B PR/PC posteriorly. Reduced fully on R and greatly on L following treatment.    INTERVENTIONS THIS SESSION: Manual: performed PA mobs through thoracic spine and R>L sacral borders to improve mobility and decrease presure on nerve roots to allow for improved posture and recruitment of muscles to prevent return of pain and spasms. Performed TP release internally to B posterior PR/PC near the coccyx to decrease pain and spasm and allow for decreased pressure on nerves for reduced "strange" sensation.  Total time: 60 min.                         PT Education - 03/13/18 1238    Education Details  Reviewed HEP exercises to emphasize and why.    Person(s) Educated  Patient    Methods  Explanation    Comprehension  Verbalized understanding       PT Short Term Goals - 03/06/18 1215      PT SHORT TERM GOAL #1   Title  Patient will demonstrate improved sitting and standing posture to demonstrate learning and decrease stress on the pelvic floor with functional activity.    Baseline  Anterior pelvic ,     Time  6    Period  Weeks    Status  On-going    Target Date  05/22/18      PT SHORT TERM GOAL #2   Title  Patient will demonstrate improved pelvic alignment and balance of musculature surrounding the  pelvis to facilitate decreased PFM spasms and decrease pelvic pain.    Baseline  L up-slip, R leg-long    Time  6    Period  Weeks    Status  Achieved    Target Date  11/29/18      PT SHORT TERM GOAL #3   Title  Patient will report a reduction in pain to no greater than 1/10 over the prior week to demonstrate symptom improvement.    Baseline  3/10 over past week (debilitating during infection), As of 1-31: Max of 2/10    Time  6    Period  Weeks    Status  On-going    Target Date  05/22/18        PT Long Term Goals - 03/06/18 1217      PT LONG TERM GOAL #1   Title  Patient will report no pain with intercourse or other vaginal penetration to demonstrate improved functional ability.    Baseline  Pain with initial penetration    Time  12     Period  Weeks    Status  Unable to assess    Target Date  05/22/18      PT LONG TERM GOAL #2   Title  Patient will score less than or equal to 15% on the Female NIH-CPSI to demonstrate a reduction in pain, urinary symptoms, and an improved quality of life.    Baseline  Female NIH-CPSI: 14/43 (33%),  VQ: 11/33 (33%)    Time  12    Period  Weeks    Status  On-going    Target Date  05/22/18      PT LONG TERM GOAL #3   Title  Patient will be independent with HEP and stress-management routines in order to return to PLOF.    Time  12    Period  Weeks    Status  Achieved    Target Date  02/27/18      PT LONG TERM GOAL #4   Title  Patient will describe pain no greater than 0/10 during normal daily activities to demonstrate improved functional ability.    Baseline  3/10 max    Time  12    Period  Weeks    Status  On-going    Target Date  05/22/18            Plan - 03/13/18 1240    Clinical Impression Statement  Pt. responded well to all interventions today, demonstrating improved mobiliity and decreased pain with pressure through spine as well as understanding of how her HEP exercises are necessary to continue to improve Sx. and decreased PFM spasms that are pulling coccyx into flexion to allow for mobilization at next visit. Continue per POC.    Clinical Presentation  Stable    Clinical Decision Making  Moderate    Rehab Potential  Good    PT Frequency  1x / week    PT Duration  12 weeks    PT Treatment/Interventions  ADLs/Self Care Home Management;Biofeedback;Electrical Stimulation;Moist Heat;Functional mobility training;Therapeutic activities;Therapeutic exercise;Balance training;Patient/family education;Neuromuscular re-education;Manual techniques;Passive range of motion;Taping;Dry needling;Spinal Manipulations;Joint Manipulations    PT Next Visit Plan  Internal TP release and coccyx mobs out of flexion and/or work to decrease hip-flexor and low back spasm/tension, posterior  hip strengthening.    PT Home Exercise Plan  Bird-dog, piriformis stretch, child's pose, kegels, diaphragmatic breathing, hip-flexor stretch, side-stretch, bow-and-arrow, side-plank, knee plank, hip ext, wall squats, monster walks, seated and standing  hip-flexor stretch, neural flossing for RLE, chest-press to decrease rib-flare.Marland Kitchen    Consulted and Agree with Plan of Care  Patient       Patient will benefit from skilled therapeutic intervention in order to improve the following deficits and impairments:  Increased fascial restricitons, Improper body mechanics, Pain, Hypermobility, Increased muscle spasms, Postural dysfunction, Decreased activity tolerance, Decreased range of motion, Decreased balance  Visit Diagnosis: Other muscle spasm  Abnormal posture  Myalgia     Problem List Patient Active Problem List   Diagnosis Date Noted  . Tachycardia   . Dysmenorrhea   . Vulvodynia   . Hypertension 08/26/2016   Cleophus Molt DPT, ATC Cleophus Molt 03/13/2018, 12:46 PM   [facility] MAIN [facility] SERVICES [address] [place], [place], [address] Phone: [phone]   Fax:  [phone]  Name: Carrie Kennedy MRN: [id number] Date of Birth: January 28, 1986

## 2018-03-20 ENCOUNTER — Ambulatory Visit: Payer: 59

## 2018-03-20 DIAGNOSIS — M62838 Other muscle spasm: Secondary | ICD-10-CM | POA: Diagnosis not present

## 2018-03-20 DIAGNOSIS — M791 Myalgia, unspecified site: Secondary | ICD-10-CM

## 2018-03-20 DIAGNOSIS — R293 Abnormal posture: Secondary | ICD-10-CM

## 2018-03-20 NOTE — Patient Instructions (Addendum)
     Inhale as you lower down gradually releasing the glutes and the lower tummy muscles. As you exhale, gradually increase the tension in the glutes and lower tummy until you reach the top and give an "extra squeeze" at the top. Repeat 10x_3_, _1-2__ Times per day.    Place foam roller or towel under your upper back between your shoulder blades. Support your head with your hands, elbows forward, and gently rock back and forth and side to side to improve motion in your back.     Exhale as you pull your shoulder blades back and together and pull your chin straight in without looking up.  Mini-Marches    Exhale, drawing the lower tummy (TA) in toward the back bone and hold contraction while you lift one foot ~ 2 inches off the mat, then the other foot before relaxing and resetting. Try to keep your hips from rocking, using your hands to sense whether they are staying even as pictured.      Perform _10__ repetitions for __3_ sets. Do this _1-2_ times per day.

## 2018-03-20 NOTE — Therapy (Addendum)
[place] [facility] MAIN [place] [facility] SERVICES [address] [place], [place], [address] Phone: [phone]   Fax:  [phone]  Physical Therapy Treatment  Patient Details  Name: Carrie Kennedy MRN: [id number] Date of Birth: 27-Nov-1985 Referring Provider (PT): Eligha BridegroomMichelle Louis   Encounter Date: 03/20/2018  PT End of Session - 03/23/18 1158    Visit Number  13    Number of Visits  20    Date for PT Re-Evaluation  05/22/18    Authorization - Visit Number  3    Authorization - Number of Visits  12    PT Start Time  0805    PT Stop Time  0905    PT Time Calculation (min)  60 min    Activity Tolerance  Patient tolerated treatment well    Behavior During Therapy  [facility]/per[facility]       Past Medical History:  Diagnosis Date  . Dysmenorrhea   . Hyperlipidemia   . Hypertension   . Tachycardia   . Vulvodynia     Past Surgical History:  Procedure Laterality Date  . EAR      TUBES    There were no vitals filed for this visit.    Pelvic Floor Physical Therapy Treatment Note  SCREENING  Changes in medications, allergies, or medical history?: no    SUBJECTIVE  Patient reports: Much less Hip-flexor tightness, mostly when driving. Still having a little bit of neural tension through her R side which is felt with prolonged sitting.  Pain update: No pain  Patient Goals: To decrease tightening in the PFM and loosen up.   OBJECTIVE  Changes in: Posture/Observations:  Hyperkyphotic/lordotic with rib-flare   Strength/MMT:  Decreased glute engagement  Abdominal: Continued difficulty engaging TA without cueing.   Palpation: TTP to B lumbar multifidus and L paraspinals.    INTERVENTIONS THIS SESSION: Therex: prone scapular retractions with chin-tuck and thoracic extensions over towel roll to improve thoracic mobility and decrease hyperkyphosis to allow for improved pelvic alignment. NM re-ed: Educated on and practiced  kneeling squats and mini-marches to improve recruitment and timing of muscle surrounding the pelvis to decrease anterior pelvic tilt and take pressure off of the lumbar nerve roots for further release of PFM tension and decreased pain with intercourse. Manual: Performed STM and TP release to B lumbar multifidus and L paraspinals to decrease spasm and allow for improved pelvic alignment for improved PFM relaxation and function. Dry-needle: Performed TPDN with a .30x3375mm needle to B lumbar multifidus and L paraspinals to decrease spasm and allow for improved pelvic alignment for improved PFM relaxation and function.  Total time: 60 min.                         PT Education - 03/23/18 1158    Education Details  See Pt. Instructions and Interventions this session.    Person(s) Educated  Patient    Methods  Explanation;Demonstration;Tactile cues;Verbal cues;Handout    Comprehension  Verbalized understanding;Returned demonstration;Verbal cues required;Tactile cues required;Need further instruction       PT Short Term Goals - 03/20/18 [id number]      PT SHORT TERM GOAL #1   Title  Patient will demonstrate improved sitting and standing posture to demonstrate learning and decrease stress on the pelvic floor with functional activity.    Baseline  Anterior pelvic ,     Time  6    Period  Weeks    Status  Achieved    Target Date  05/22/18      PT SHORT TERM GOAL #2   Title  Patient will demonstrate improved pelvic alignment and balance of musculature surrounding the pelvis to facilitate decreased PFM spasms and decrease pelvic pain.    Baseline  L up-slip, R leg-long    Time  6    Period  Weeks    Status  Achieved    Target Date  11/29/18      PT SHORT TERM GOAL #3   Title  Patient will report a reduction in pain to no greater than 1/10 over the prior week to demonstrate symptom improvement.    Baseline  3/10 over past week (debilitating during infection), As of 1-31: Max of  2/10    Time  6    Period  Weeks    Status  On-going    Target Date  05/22/18        PT Long Term Goals - 03/20/18 0815      PT LONG TERM GOAL #1   Title  Patient will report no pain with intercourse or other vaginal penetration to demonstrate improved functional ability.    Baseline  Pain with initial penetration    Time  12    Period  Weeks    Status  Unable to assess    Target Date  05/22/18      PT LONG TERM GOAL #2   Title  Patient will score less than or equal to 15% on the Female NIH-CPSI to demonstrate a reduction in pain, urinary symptoms, and an improved quality of life.    Baseline  Female NIH-CPSI: 14/43 (33%),  VQ: 11/33 (33%) As of 2/14: VQ: 7/33 (15%), Female NIH-CPSI: 5/43 (%),  (Pended)     Time  12    Period  Weeks    Status  On-going    Target Date  05/22/18      PT LONG TERM GOAL #3   Title  Patient will be independent with HEP and stress-management routines in order to return to PLOF.    Time  12    Period  Weeks    Status  Achieved    Target Date  02/27/18      PT LONG TERM GOAL #4   Title  Patient will describe pain no greater than 0/10 during normal daily activities to demonstrate improved functional ability.    Baseline  3/10 max, as of 2/14: 2/10     Time  12    Period  Weeks    Status  On-going    Target Date  05/22/18            Plan - 03/23/18 1159    Clinical Impression Statement  Pt. responded well to all interventions this session, demonstrating understanding and correct performance of all education and exercises as well as decreased spasm and improved ability to recruit deep-core following treatment. Continue per POC.    Clinical Presentation  Stable    Clinical Decision Making  Moderate    Rehab Potential  Good    PT Frequency  1x / week    PT Duration  12 weeks    PT Treatment/Interventions  ADLs/Self Care Home Management;Biofeedback;Electrical Stimulation;Moist Heat;Functional mobility training;Therapeutic  activities;Therapeutic exercise;Balance training;Patient/family education;Neuromuscular re-education;Manual techniques;Passive range of motion;Taping;Dry needling;Spinal Manipulations;Joint Manipulations    PT Next Visit Plan  Internal TP release and coccyx mobs out of flexion and/or work to decrease hip-flexor spasm/tension, review new HEP for posterior  hip strengthening.    PT Home Exercise Plan  Bird-dog, piriformis stretch, child's pose, kegels, diaphragmatic breathing, hip-flexor stretch, side-stretch, bow-and-arrow, side-plank, knee plank, hip ext, wall squats, monster walks, seated and standing hip-flexor stretch, neural flossing for RLE, chest-press to decrease rib-flare.Marland Kitchen    Consulted and Agree with Plan of Care  Patient       Patient will benefit from skilled therapeutic intervention in order to improve the following deficits and impairments:  Increased fascial restricitons, Improper body mechanics, Pain, Hypermobility, Increased muscle spasms, Postural dysfunction, Decreased activity tolerance, Decreased range of motion, Decreased balance  Visit Diagnosis: Other muscle spasm  Abnormal posture  Myalgia     Problem List Patient Active Problem List   Diagnosis Date Noted  . Tachycardia   . Dysmenorrhea   . Vulvodynia   . Hypertension 08/26/2016   Cleophus Molt DPT, ATC Cleophus Molt 03/23/2018, 12:01 PM  [place] [facility] MAIN [facility] SERVICES [address] [place], [place], [address] Phone: [phone]   Fax:  [phone]  Name: Carrie Kennedy MRN: [id number] Date of Birth: 1985-05-31

## 2018-03-27 ENCOUNTER — Ambulatory Visit: Payer: 59

## 2018-04-02 ENCOUNTER — Ambulatory Visit: Payer: 59

## 2018-04-02 DIAGNOSIS — M791 Myalgia, unspecified site: Secondary | ICD-10-CM

## 2018-04-02 DIAGNOSIS — M62838 Other muscle spasm: Secondary | ICD-10-CM | POA: Diagnosis not present

## 2018-04-02 DIAGNOSIS — R293 Abnormal posture: Secondary | ICD-10-CM

## 2018-04-02 NOTE — Patient Instructions (Signed)
Pelvic Tilt With Pelvic Floor (Hook-Lying)        Lie with hips and knees bent. Squeeze pelvic floor and flatten low back while breathing out so that pelvis tilts. Repeat _3x10__ times. Do _1-2__ times a day.  * fold a small towel up to be ~ 1 in. Thick and 3-4 in. Wide and place it under your low back. As you exhale, pretend you are blowing through a straw "kissy lips" and draw the lower tummy in, followed by upper abs, then a LITTLE from the glutes. Do NOT let the butt take over!

## 2018-04-02 NOTE — Therapy (Signed)
[facility] MAIN [facility] SERVICES [address] [place], [place], [address] Phone: [phone]   Fax:  [phone]  Physical Therapy Treatment  Patient Details  Name: Carrie Kennedy MRN: [id number] Date of Birth: [birth year] Referring Provider (PT): Eligha Bridegroom   Encounter Date: 04/02/2018  PT End of Session - 04/02/18 1850    Visit Number  14    Number of Visits  20    Date for PT Re-Evaluation  05/22/18    Authorization - Visit Number  4    Authorization - Number of Visits  12    PT Start Time  1530    PT Stop Time  1630    PT Time Calculation (min)  60 min    Activity Tolerance  Patient tolerated treatment well    Behavior During Therapy  [facility] for tasks assessed/performed       Past Medical History:  Diagnosis Date  . Dysmenorrhea   . Hyperlipidemia   . Hypertension   . Tachycardia   . Vulvodynia     Past Surgical History:  Procedure Laterality Date  . EAR      TUBES    There were no vitals filed for this visit.    Pelvic Floor Physical Therapy Treatment Note  SCREENING  Changes in medications, allergies, or medical history?: none     SUBJECTIVE  Patient reports: Had some R flank pain that came and went. Still getting a little hip-flexor pain on the R but it is less.  Pain update:  Location of pain: R hip Current pain:  0/10  Max pain:  1/10 Least pain:  0/10 Nature of pain: tightness  Patient Goals: To decrease tightening in the PFM and loosen up.   OBJECTIVE  Changes in: Posture/Observations:  Hyperkyphosis/lordosis.  Range of Motion/Flexibilty:  Pt demonstrates decreased thoracic mobility   Abdominal:  Pt. Demonstrates difficulty with recruitment and coordination of the TA, Obliques, and glutes for proper exercise performance.   INTERVENTIONS THIS SESSION: Therex: reviewed tall kneeling squats, rib-flare correction exercise, and posterior pelvic tilts in hook-lying with maximal verbal, visual  and tactile cueing.  Total time: 60 min                         PT Education - 04/02/18 1849    Education Details  See Pt. Instructions and Interventions this session.    Person(s) Educated  Patient    Methods  Explanation;Demonstration;Tactile cues;Verbal cues;Handout    Comprehension  Verbalized understanding;Returned demonstration;Verbal cues required;Tactile cues required;Need further instruction       PT Short Term Goals - 03/20/18 9163      PT SHORT TERM GOAL #1   Title  Patient will demonstrate improved sitting and standing posture to demonstrate learning and decrease stress on the pelvic floor with functional activity.    Baseline  Anterior pelvic ,     Time  6    Period  Weeks    Status  Achieved    Target Date  05/22/18      PT SHORT TERM GOAL #2   Title  Patient will demonstrate improved pelvic alignment and balance of musculature surrounding the pelvis to facilitate decreased PFM spasms and decrease pelvic pain.    Baseline  L up-slip, R leg-long    Time  6    Period  Weeks    Status  Achieved    Target Date  11/29/18      PT  SHORT TERM GOAL #3   Title  Patient will report a reduction in pain to no greater than 1/10 over the prior week to demonstrate symptom improvement.    Baseline  3/10 over past week (debilitating during infection), As of 1-31: Max of 2/10    Time  6    Period  Weeks    Status  On-going    Target Date  05/22/18        PT Long Term Goals - 03/20/18 0815      PT LONG TERM GOAL #1   Title  Patient will report no pain with intercourse or other vaginal penetration to demonstrate improved functional ability.    Baseline  Pain with initial penetration    Time  12    Period  Weeks    Status  Unable to assess    Target Date  05/22/18      PT LONG TERM GOAL #2   Title  Patient will score less than or equal to 15% on the Female NIH-CPSI to demonstrate a reduction in pain, urinary symptoms, and an improved quality of life.     Baseline  Female NIH-CPSI: 14/43 (33%),  VQ: 11/33 (33%) As of 2/14: VQ: 7/33 (15%), Female NIH-CPSI: 5/43 (%),  (Pended)     Time  12    Period  Weeks    Status  On-going    Target Date  05/22/18      PT LONG TERM GOAL #3   Title  Patient will be independent with HEP and stress-management routines in order to return to PLOF.    Time  12    Period  Weeks    Status  Achieved    Target Date  02/27/18      PT LONG TERM GOAL #4   Title  Patient will describe pain no greater than 0/10 during normal daily activities to demonstrate improved functional ability.    Baseline  3/10 max, as of 2/14: 2/10     Time  12    Period  Weeks    Status  On-going    Target Date  05/22/18            Plan - 04/02/18 1851    Clinical Impression Statement  Pt. demonstrated appropriate coordination and recruitment at end of session with posterior pelvic tilt and kneeling squats but will require review for standing rib-flare correction and will benefit from further manual treatment to improve thoracic mobility. Continue per POC.     Clinical Presentation  Stable    Clinical Decision Making  Moderate    Rehab Potential  Good    PT Frequency  1x / week    PT Duration  12 weeks    PT Treatment/Interventions  ADLs/Self Care Home Management;Biofeedback;Electrical Stimulation;Moist Heat;Functional mobility training;Therapeutic activities;Therapeutic exercise;Balance training;Patient/family education;Neuromuscular re-education;Manual techniques;Passive range of motion;Taping;Dry needling;Spinal Manipulations;Joint Manipulations    PT Next Visit Plan  Internal TP release and coccyx mobs out of flexion and/or work to decrease hip-flexor spasm/tension, review rib-flare correction and progress posterior pelvic tilts to mini-marches.    PT Home Exercise Plan  Bird-dog, piriformis stretch, child's pose, kegels, diaphragmatic breathing, hip-flexor stretch, side-stretch, bow-and-arrow, side-plank, knee plank, hip ext,  wall squats, monster walks, seated and standing hip-flexor stretch, neural flossing for RLE, chest-press to decrease rib-flare.Earlyne Iba and Agree with Plan of Care  Patient       Patient will benefit from skilled therapeutic intervention in order to improve the following  deficits and impairments:  Increased fascial restricitons, Improper body mechanics, Pain, Hypermobility, Increased muscle spasms, Postural dysfunction, Decreased activity tolerance, Decreased range of motion, Decreased balance  Visit Diagnosis: Other muscle spasm  Abnormal posture  Myalgia     Problem List Patient Active Problem List   Diagnosis Date Noted  . Tachycardia   . Dysmenorrhea   . Vulvodynia   . Hypertension 08/26/2016   Cleophus Molt DPT, ATC Cleophus Molt 04/02/2018, 6:59 PM  [place] [facility] MAIN [facility] SERVICES [address] [place], [place], [address] Phone: [phone]   Fax:  [phone]  Name: Carrie Kennedy MRN: [id number] Date of Birth: 10-Jan-1986

## 2018-04-07 ENCOUNTER — Ambulatory Visit: Payer: 59 | Attending: Obstetrics & Gynecology

## 2018-04-07 DIAGNOSIS — R293 Abnormal posture: Secondary | ICD-10-CM | POA: Insufficient documentation

## 2018-04-07 DIAGNOSIS — M62838 Other muscle spasm: Secondary | ICD-10-CM | POA: Insufficient documentation

## 2018-04-07 DIAGNOSIS — M791 Myalgia, unspecified site: Secondary | ICD-10-CM | POA: Insufficient documentation

## 2018-04-10 ENCOUNTER — Ambulatory Visit: Payer: 59 | Admitting: Physical Therapy

## 2018-04-10 DIAGNOSIS — M791 Myalgia, unspecified site: Secondary | ICD-10-CM | POA: Diagnosis present

## 2018-04-10 DIAGNOSIS — M62838 Other muscle spasm: Secondary | ICD-10-CM | POA: Diagnosis not present

## 2018-04-10 DIAGNOSIS — R293 Abnormal posture: Secondary | ICD-10-CM | POA: Diagnosis present

## 2018-04-10 NOTE — Therapy (Signed)
[place] [facility] MAIN [facility] SERVICES [address] [place], [place], [address] Phone: [phone]   Fax:  [phone]  Physical Therapy Treatment  Patient Details  Name: Carrie Kennedy MRN: [id number] Date of Birth: 18-Mar-1985 Referring Provider (PT): Eligha Bridegroom   Encounter Date: 04/10/2018  PT End of Session - 04/10/18 0859    Visit Number  15    Number of Visits  20    Date for PT Re-Evaluation  05/22/18    Authorization - Visit Number  4    Authorization - Number of Visits  12    PT Start Time  0807    PT Stop Time  0904    PT Time Calculation (min)  57 min    Activity Tolerance  Patient tolerated treatment well    Behavior During Therapy  [facility] for tasks assessed/performed       Past Medical History:  Diagnosis Date  . Dysmenorrhea   . Hyperlipidemia   . Hypertension   . Tachycardia   . Vulvodynia     Past Surgical History:  Procedure Laterality Date  . EAR      TUBES    There were no vitals filed for this visit.  Subjective Assessment - 04/10/18 0811    Subjective  Pt notices her midback mm tightness was not as bad when she had respiratory issues recently compared to past episodes.           [facility] PT Assessment - 04/10/18 0904      Palpation   Spinal mobility  increased paraspinal tightness on R, lower scap mm attachment tightness , serratus anterior                    [facility] Adult PT Treatment/Exercise - 04/10/18 0900      Neuro Re-ed    Neuro Re-ed Details   cued for new HEP for rotational component to scoliosis       Manual Therapy   Manual therapy comments  STM/MWM at problem spots noted in assessment                PT Short Term Goals - 03/20/18 2800      PT SHORT TERM GOAL #1   Title  Patient will demonstrate improved sitting and standing posture to demonstrate learning and decrease stress on the pelvic floor with functional activity.    Baseline  Anterior pelvic ,     Time  6    Period  Weeks    Status  Achieved    Target Date  05/22/18      PT SHORT TERM GOAL #2   Title  Patient will demonstrate improved pelvic alignment and balance of musculature surrounding the pelvis to facilitate decreased PFM spasms and decrease pelvic pain.    Baseline  L up-slip, R leg-long    Time  6    Period  Weeks    Status  Achieved    Target Date  11/29/18      PT SHORT TERM GOAL #3   Title  Patient will report a reduction in pain to no greater than 1/10 over the prior week to demonstrate symptom improvement.    Baseline  3/10 over past week (debilitating during infection), As of 1-31: Max of 2/10    Time  6    Period  Weeks    Status  On-going    Target Date  05/22/18        PT Long  Term Goals - 04/10/18 6213      PT LONG TERM GOAL #1   Title  Patient will report no pain with intercourse or other vaginal penetration to demonstrate improved functional ability.    Baseline  Pain with initial penetration    Time  12    Period  Weeks    Status  Unable to assess      PT LONG TERM GOAL #2   Title  Patient will score less than or equal to 15% on the Female NIH-CPSI to demonstrate a reduction in pain, urinary symptoms, and an improved quality of life.    Baseline  Female NIH-CPSI: 14/43 (33%),  VQ: 11/33 (33%) As of 2/14: VQ: 7/33 (15%), Female NIH-CPSI: 5/43 (%),  As of 3/6: 0%)     Time  12    Period  Weeks    Status  Achieved      PT LONG TERM GOAL #3   Title  Patient will be independent with HEP and stress-management routines in order to return to PLOF.    Time  12    Period  Weeks    Status  Achieved      PT LONG TERM GOAL #4   Title  Patient will describe pelvic pain no greater than 0/10 during normal daily activities to demonstrate improved functional ability.    Baseline  3/10 max, as of 2/14: 2/10   , as of 3/6:  0/10 ( switching medication to Cymbalta has helped)     Time  12    Period  Weeks    Status  Achieved      PT LONG TERM GOAL #5   Title  Pt will  demo decreased mm tightness at R T/L junction with less posterior rotation of trunk in order to minimzie worsening of rotaional component to scoliosis     Time  4    Period  Weeks    Status  New            Plan - 04/10/18 1428    Clinical Impression Statement  Pt demo'd decreased R thoracic mm tightness after Tx today. Focused on new HEP to address rotational component to scoliosis. Educated about pelvic function with anatomy/ physiology. Pt continues to benefit from skilled PT    Rehab Potential  Good    PT Frequency  1x / week    PT Duration  12 weeks    PT Treatment/Interventions  ADLs/Self Care Home Management;Biofeedback;Electrical Stimulation;Moist Heat;Functional mobility training;Therapeutic activities;Therapeutic exercise;Balance training;Patient/family education;Neuromuscular re-education;Manual techniques;Passive range of motion;Taping;Dry needling;Spinal Manipulations;Joint Manipulations    PT Next Visit Plan  Internal TP release and coccyx mobs out of flexion and/or work to decrease hip-flexor spasm/tension, review rib-flare correction and progress posterior pelvic tilts to mini-marches.    PT Home Exercise Plan  Bird-dog, piriformis stretch, child's pose, kegels, diaphragmatic breathing, hip-flexor stretch, side-stretch, bow-and-arrow, side-plank, knee plank, hip ext, wall squats, monster walks, seated and standing hip-flexor stretch, neural flossing for RLE, chest-press to decrease rib-flare.Marland Kitchen    Consulted and Agree with Plan of Care  Patient       Patient will benefit from skilled therapeutic intervention in order to improve the following deficits and impairments:  Increased fascial restricitons, Improper body mechanics, Pain, Hypermobility, Increased muscle spasms, Postural dysfunction, Decreased activity tolerance, Decreased range of motion, Decreased balance  Visit Diagnosis: Other muscle spasm  Abnormal posture  Myalgia     Problem List Patient Active Problem  List   Diagnosis Date  Noted  . Tachycardia   . Dysmenorrhea   . Vulvodynia   . Hypertension 08/26/2016    Mariane Masters ,PT, DPT, E-RYT  04/10/2018, 2:30 PM  [place] [facility] MAIN [facility] SERVICES [address] [place], [place], [address] Phone: [phone]   Fax:  [phone]  Name: Carrie Kennedy MRN: [id number] Date of Birth: [birth year]

## 2018-04-10 NOTE — Patient Instructions (Signed)
Education on functional positions for pelvic floor functions   Rotational component for scoliosis:   Behind the shoulder blades : 1) To release midback pain/ muscle tensions:  Yoga block (2") behind midback longways , a block under head   Reposition into lengthen spine , pelvic neutral  Allow knees to move ~10-15 deg so to allow your body weight to hang over edge of the block and back to center, then the other side  10 min here.   2) Bear at a tree  at the doorway    ___  Stretching at the doorknob To length the side ( move feet/ hip over to "C"  Add thread the needle -- place hand on opp thigh

## 2018-05-08 ENCOUNTER — Encounter: Payer: 59 | Admitting: Physical Therapy

## 2018-05-22 ENCOUNTER — Encounter: Payer: 59 | Admitting: Physical Therapy

## 2018-05-29 ENCOUNTER — Ambulatory Visit: Payer: 59 | Attending: Obstetrics & Gynecology | Admitting: Physical Therapy

## 2018-05-29 ENCOUNTER — Encounter: Payer: 59 | Admitting: Physical Therapy

## 2018-05-29 ENCOUNTER — Other Ambulatory Visit: Payer: Self-pay

## 2018-05-29 DIAGNOSIS — M791 Myalgia, unspecified site: Secondary | ICD-10-CM | POA: Diagnosis present

## 2018-05-29 DIAGNOSIS — M62838 Other muscle spasm: Secondary | ICD-10-CM | POA: Diagnosis not present

## 2018-05-29 DIAGNOSIS — R293 Abnormal posture: Secondary | ICD-10-CM | POA: Diagnosis present

## 2018-05-29 NOTE — Patient Instructions (Addendum)
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:  _ instead of standing bent forward , stand in ski track position, then lunge (L  Leg in front closer to the platform, body at 2 oclock posiion to platform)   _wiping down the platform, standing perpendicular, sidebend to wipe   2) Transition from standing to floor with downward facing dog to strengthen shoulders when at home  Wide squat like you are about to pick something up from the floor --> crawl hand down on thigh and then reach other hand onto the ground (all fours)  To get up, all fours--> lifts hips in to Downward Facing Dog  and walk hands backwards to feet --> mini quat --> hands on thighs, then hips then pause HERE  to avoid (moving too quickly up/ blood rush) -->  knees glide forward and roll  Hips up instead of hinging spine up   * KEEP YOUR HEAD AND HEART LEVELLED , NEVER LETTING YOUR HEAD GET BELOW YOUR HEART   ____ SLOW STRENGTHENING EXERCISES FOR SCAPULAR STABILIZATION AND NECK PROPIOCEPTION   1) One arm push up by the wall , standing perpendicular, arms width away  ( hand placed slightly lower than shoulder height) ,elbow bent pointing down, close to ribs  r side 20 reps,  l side 10 reps,  Push wall away with "paw" "spide man sides"    2) Isometric holds for shoulder/neck to help with neck alignment and strengthening shoulder blade muscles  Feet two feet away from the wall, standing against wall like a squat , knees above ankles   Pressing feet into the ground, knees above ankles Keep back of hips/ shoulders/ head pressed   against by the wall 5 sec holds   5 reps  EACH 3 X DAY

## 2018-05-29 NOTE — Therapy (Signed)
[facility] MAIN [facility] [address] [place], [place], [address] Phone: [phone]   Fax:  [phone]  Physical Therapy Treatment / Progress Note  Patient Details  Name: Carrie Kennedy MRN: [id number] Date of Birth: Aug 14, 1985 Referring Provider (PT): Eligha BridegroomMichelle Louis   Encounter Date: 05/29/2018  PT End of Session - 05/29/18 1101    Visit Number  16    Number of Visits  20    Date for PT Re-Evaluation  08/21/18    PT Start Time  0857    PT Stop Time  0940    PT Time Calculation (min)  43 min    Activity Tolerance  Patient tolerated treatment well    Behavior During Therapy  [facility] for tasks assessed/performed       Past Medical History:  Diagnosis Date  . Dysmenorrhea   . Hyperlipidemia   . Hypertension   . Tachycardia   . Vulvodynia     Past Surgical History:  Procedure Laterality Date  . EAR      TUBES    There were no vitals filed for this visit.  Subjective Assessment - 05/29/18 0858    Subjective  Pt reports one month ago after her last PT session, the manual Tx and HEP has helped her neck / shoulder for the past month. During the day, pt tries to stretch it out during the day if it is hurting her. Pt experiences the "kinks" in her neck / shoulder more so in the afternoon and upon getting home.  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  With larger and heavier animals, pt willl lift with other teammates.          [facility] PT Assessment - 05/29/18 1058      Floor to Stand   Comments  poor knee alignment      Other:   Other/ Comments  simulated holding animals at work ( demon'd rounded shoulders)       Strength   Overall Strength  Other (comment)   R UE single arm push ups standing ( reported fatigue faster)                  [facility] Adult PT Treatment/Exercise - 05/29/18 1051      Therapeutic Activites    Other Therapeutic Activities  Provided cues for  proper alignment / propioception for better posture, less back strain, less rounded shoulders with work activities ( picking up animals 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    Baseline  Anterior pelvic ,     Time  6    Period  Weeks    Status  Achieved    Target Date  05/22/18      PT SHORT TERM GOAL #2   Title  Patient will demonstrate improved pelvic alignment and balance of musculature surrounding the pelvis to facilitate decreased PFM spasms and decrease pelvic pain.    Baseline  L up-slip, R leg-long    Time  6    Period  Weeks    Status  Achieved    Target Date  11/29/18      PT SHORT TERM GOAL #3   Title  Patient will report a reduction in pain to no greater than 1/10 over the prior week to demonstrate symptom improvement.    Baseline  3/10 over past week (debilitating during infection), As of 1-31: Max of 2/10    Time  6    Period  Weeks    Status  On-going    Target Date  05/22/18        PT Long Term Goals - 04/10/18 1308      PT LONG TERM GOAL #1   Title  Patient will report no pain with intercourse or other vaginal penetration to demonstrate improved functional ability.    Baseline  Pain with initial penetration    Time  12    Period  Weeks    Status  Unable to assess      PT LONG TERM GOAL #2   Title  Patient will score less than or equal to 15% on the Female NIH-CPSI to demonstrate a reduction in pain, urinary  symptoms, and an improved quality of life.    Baseline  Female NIH-CPSI: 14/43 (33%),  VQ: 11/33 (33%) As of 2/14: VQ: 7/33 (15%), Female NIH-CPSI: 5/43 (%),  As of 3/6: 0%)     Time  12    Period  Weeks    Status  Achieved      PT LONG TERM GOAL #3   Title  Patient will be independent with HEP and stress-management routines in order to return to PLOF.    Time  12    Period  Weeks    Status  Achieved      PT LONG TERM GOAL #4   Title  Patient will describe pelvic pain no greater than 0/10 during normal daily activities to demonstrate improved functional ability.    Baseline  3/10 max, as of 2/14: 2/10   , as of 3/6:  0/10 ( switching medication to Cymbalta has helped)     Time  12    Period  Weeks    Status  Achieved      PT LONG TERM GOAL #5   Title  Pt will demo decreased mm tightness at R T/L junction with less posterior rotation of trunk in order to minimzie worsening of rotaional component to scoliosis     Time  4    Period  Weeks    Status  On going            Plan - 05/29/18 1101    Clinical Impression Statement  Pt has achieved 2/3 STG, 4/5 LTG with significantly less pelvic and low back pain. Scoliosis has been addressed with manual Tx and specific HEP. Currently working to minimize neck/shoulder issues 2/2 to thoracolumbar scoliosis and repeated lifting/ carrying 40-60 lb animals and forward bending at work at an [employer].  Recert is being submitted. Pt's last PT session occurred on 04/10/18 and pt reports that the manual Tx and HEP performed at that visit has helped her neck / shoulder for the past month.  Pt experiences the "kinks" in her neck / shoulder more so  in the afternoon and upon getting home.  Clinic has been closed over the past month due to COVID-19 pandemic. Performed today's session via telehealth.    Telehealth Visit: DPT connected with pt today at 8:57am by Webex video conference and verified correct person using two identifiers.  DPT  discussed the limitations, risks, security and privacy concerns of performing an evaluation and management service by Webex and the availability of in person appointments. DPT discussed with the patient that there may be a patient responsible charge related to this service. The patient expressed understanding and agreed to proceed. The patient's address was confirmed.  Identified to the patient that therapist is a licensed DPT  in the state of [place].Verified phone # to call in case of technical difficulties.  Following pt's Tx today, pt demo'd improved floor to stand transitions, better bending body mechanics, and standing positions to be applied at work to minimize strain on pelvic floor, spine, shoulders/neck. Pt also showed improved alignment and technique with scapular stabilization exercises by end of session. 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  Educated pt about progression of strengthening with isometric exercises and withholding resistance strengthening at a later time to minimize relapse of increased overactivity of mm.  Pt reported decreased neck/ shoulder pain by 50% by the end of session.  Pt will continue to benefit from skilled PT.       Rehab Potential  Good    PT Frequency  1x / week    PT Duration  12 weeks    PT Treatment/Interventions  ADLs/Self Care Home Management;Biofeedback;Electrical Stimulation;Moist Heat;Functional mobility training;Therapeutic activities;Therapeutic exercise;Balance training;Patient/family education;Neuromuscular re-education;Manual techniques;Passive range of motion;Taping;Dry needling;Spinal Manipulations;Joint Manipulations    PT Next Visit Plan  Internal TP release and coccyx mobs out of flexion and/or work to decrease hip-flexor spasm/tension, review rib-flare correction and progress  posterior pelvic tilts to mini-marches.    PT Home Exercise Plan  Bird-dog, piriformis stretch, child's pose, kegels, diaphragmatic breathing, hip-flexor stretch, side-stretch, bow-and-arrow, side-plank, knee plank, hip ext, wall squats, monster walks, seated and standing hip-flexor stretch, neural flossing for RLE, chest-press to decrease rib-flare.Marland Kitchen    Consulted and Agree with Plan of Care  Patient       Patient will benefit from skilled therapeutic intervention in order to improve the following deficits and impairments:  Increased fascial restricitons, Improper body mechanics, Pain, Hypermobility, Increased muscle spasms, Postural dysfunction, Decreased activity tolerance, Decreased range of motion, Decreased balance  Visit Diagnosis: Other muscle spasm  Abnormal posture  Myalgia     Problem List Patient Active Problem List   Diagnosis Date Noted  . Tachycardia   . Dysmenorrhea   . Vulvodynia   . Hypertension 08/26/2016    Mariane Masters ,PT, DPT, E-RYT  05/29/2018, 11:07 AM  [facility] MAIN [facility] SERVICES [address] [place], [place], [address] Phone: [phone]   Fax:  [phone]  Name: Carrie Kennedy MRN: [id number] Date of Birth: Oct 11, 1985

## 2018-06-05 ENCOUNTER — Ambulatory Visit: Payer: 59 | Attending: Obstetrics & Gynecology | Admitting: Physical Therapy

## 2018-06-05 ENCOUNTER — Other Ambulatory Visit: Payer: Self-pay

## 2018-06-05 ENCOUNTER — Encounter: Payer: 59 | Admitting: Physical Therapy

## 2018-06-05 DIAGNOSIS — M62838 Other muscle spasm: Secondary | ICD-10-CM | POA: Diagnosis not present

## 2018-06-05 DIAGNOSIS — R293 Abnormal posture: Secondary | ICD-10-CM | POA: Diagnosis present

## 2018-06-05 DIAGNOSIS — M791 Myalgia, unspecified site: Secondary | ICD-10-CM | POA: Insufficient documentation

## 2018-06-05 NOTE — Therapy (Addendum)
[facility] [address] [place], [place], [address] Phone: [phone]   Fax:  [phone]  Physical Therapy Treatment  Patient Details  Name: Carrie DionesKari M Kennedy MRN: [id number] Date of Birth: 03-Aug-1985 Referring Provider (PT): Bryn GullingMichelle Louie   Encounter Date: 06/05/2018  PT End of Session - 06/05/18 1209    Visit Number  17    Number of Visits  20    Date for PT Re-Evaluation  08/21/18    PT Start Time  1040    PT Stop Time  1115    PT Time Calculation (min)  35 min    Activity Tolerance  Patient tolerated treatment well    Behavior During Therapy  [facility] for tasks assessed/performed       Past Medical History:  Diagnosis Date  . Dysmenorrhea   . Hyperlipidemia   . Hypertension   . Tachycardia   . Vulvodynia     Past Surgical History:  Procedure Laterality Date  . EAR      TUBES    There were no vitals filed for this visit.  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 Her neck and shoulder exercises for scoliosis did not cause any problems.  Pt is more conscious about her sitting posture and readjusting her positions to decrease her midback pain. Pt has had PT before but she has found that what Dr. Serita KyleGailes and Dayle PointsYeung are working on is helping her pain because it is helping with their treatments have helped with the scoliosis part of her spine.          [facility] PT Assessment - 06/05/18 1205      Observation/Other Assessments   Observations  no scapular dyskinesis, no upper trap overuse B                    [facility] Adult PT Treatment/Exercise - 06/05/18 1206      Neuro Re-ed    Neuro Re-ed Details   cued for less lumbar lordosis and more ER of shoulder, scapular stabilization       Exercises   Exercises  --   see pt instructions, scoliosis HEP to minimize midtrap/ rhomboids              PT Short Term Goals - 03/20/18  [id number]      PT SHORT TERM GOAL #1   Title  Patient will demonstrate improved sitting and standing posture to demonstrate learning and decrease stress on the pelvic floor with functional activity.    Baseline  Anterior pelvic ,     Time  6    Period  Weeks    Status  Achieved    Target Date  05/22/18      PT SHORT TERM GOAL #2   Title  Patient will demonstrate improved pelvic alignment and balance of musculature surrounding the pelvis to facilitate decreased PFM spasms and decrease pelvic pain.    Baseline  L up-slip, R leg-long    Time  6    Period  Weeks    Status  Achieved    Target Date  11/29/18      PT SHORT TERM GOAL #3   Title  Patient will report a reduction in pain to no greater than 1/10 over the prior week to demonstrate symptom improvement.    Baseline  3/10 over past week (debilitating during infection),  As of 1-31: Max of 2/10    Time  6    Period  Weeks    Status  On-going    Target Date  05/22/18        PT Long Term Goals - 04/10/18 7121      PT LONG TERM GOAL #1   Title  Patient will report no pain with intercourse or other vaginal penetration to demonstrate improved functional ability.    Baseline  Pain with initial penetration    Time  12    Period  Weeks    Status  Unable to assess      PT LONG TERM GOAL #2   Title  Patient will score less than or equal to 15% on the Female NIH-CPSI to demonstrate a reduction in pain, urinary symptoms, and an improved quality of life.    Baseline  Female NIH-CPSI: 14/43 (33%),  VQ: 11/33 (33%) As of 2/14: VQ: 7/33 (15%), Female NIH-CPSI: 5/43 (%),  As of 3/6: 0%)     Time  12    Period  Weeks    Status  Achieved      PT LONG TERM GOAL #3   Title  Patient will be independent with HEP and stress-management routines in order to return to PLOF.    Time  12    Period  Weeks    Status  Achieved      PT LONG TERM GOAL #4   Title  Patient will describe pelvic pain no greater than 0/10 during normal daily activities to  demonstrate improved functional ability.    Baseline  3/10 max, as of 2/14: 2/10   , as of 3/6:  0/10 ( switching medication to Cymbalta has helped)     Time  12    Period  Weeks    Status  Achieved      PT LONG TERM GOAL #5   Title  Pt will demo decreased mm tightness at R T/L junction with less posterior rotation of trunk in order to minimzie worsening of rotaional component to scoliosis     Time  4    Period  Weeks    Status  New            Plan - 06/05/18 1205    Clinical Impression Statement  Provided telehealth session today.DPT connected with pt today by Webex video conference and verified that pt as the correct person using two identifiers. DPT discussed the limitations, risks, security and privacy concerns of performing an evaluation and management service by Webex and the availability of in person appointments.  DPT also discussed with the patient that there may be a patient responsible charge related to this service. The patient expressed understanding and agreed to proceed.  The patient's address was confirmed.  Identified to the patient that therapist is a licensed DPT in the state of [place].Verified phone #  to call in case of technical difficulties.  Pt responded positively to last telehealth session as pt reported the exercises were helpful. Pt showed no scapular kinesis which deems her readiness to advance towards bilateral strengthening BUE exercises at next session. Today, continued to focus on scoliosis specific stretches and strengthening.  Pt reported new stretches today helped to release deeper thoracic muscles. Pt requires to minimize increased lumbar lordosis. Pt showed good carry over with scapular stabilization. Pt continues to benefit from skilled PT.         Rehab Potential  Good    PT Frequency  1x / week    PT Duration  12 weeks    PT Treatment/Interventions  ADLs/Self Care Home Management;Biofeedback;Electrical Stimulation;Moist Heat;Functional mobility  training;Therapeutic activities;Therapeutic exercise;Balance training;Patient/family education;Neuromuscular re-education;Manual techniques;Passive range of motion;Taping;Dry needling;Spinal Manipulations;Joint Manipulations    PT Next Visit Plan  Internal TP release and coccyx mobs out of flexion and/or work to decrease hip-flexor spasm/tension, review rib-flare correction and progress posterior pelvic tilts to mini-marches.    PT Home Exercise Plan  Bird-dog, piriformis stretch, child's pose, kegels, diaphragmatic breathing, hip-flexor stretch, side-stretch, bow-and-arrow, side-plank, knee plank, hip ext, wall squats, monster walks, seated and standing hip-flexor stretch, neural flossing for RLE, chest-press to decrease rib-flare.Marland Kitchen    Consulted and Agree with Plan of Care  Patient       Patient will benefit from skilled therapeutic intervention in order to improve the following deficits and impairments:  Increased fascial restricitons, Improper body mechanics, Pain, Hypermobility, Increased muscle spasms, Postural dysfunction, Decreased activity tolerance, Decreased range of motion, Decreased balance  Visit Diagnosis: Other muscle spasm  Abnormal posture  Myalgia     Problem List Patient Active Problem List   Diagnosis Date Noted  . Tachycardia   . Dysmenorrhea   . Vulvodynia   . Hypertension 08/26/2016    Mariane Masters ,PT, DPT, E-RYT  06/05/2018, 12:16 PM  [facility] MAIN [facility] SERVICES [address] [place], [place], [address] Phone: [phone]   Fax:  [phone]  Name: FRANCENIA CHIMENTI MRN: [id number] Date of Birth: 05-09-1985

## 2018-06-05 NOTE — Patient Instructions (Addendum)
R neck and shoulder series  To stretch R upper trap first  _ Standing perpendicualr to wall with L palm on wall 7 o'clock    To strengthen R scapular  _ one sided push up R side  10 reps    To stretch midback :  YOGA:  eagle pose  Sit in a chair position, feet together  Cross R thigh over L, press them strongly against each other  Cross R arm over L arm, palms clasp  Stay and breath 3-5 breaths  Sitting a little deeper    Pat yourself on back/ reaching behind back with hand towel Lengthening around shoulder blade /ribs, stretching opposite pect /front shoulder muscles:   R hand pats back of your band, elbow pointed up to ceiling/ holding hand towel  Roll L shoulder back and down, grab bottom of hand towel , pull gently down  *Press back of L hand against back , inhale expand back, make sure to not over arch the low back,  Keep chin tucked, press head back as if wall is behind you

## 2018-06-12 ENCOUNTER — Other Ambulatory Visit: Payer: Self-pay

## 2018-06-12 ENCOUNTER — Ambulatory Visit: Payer: 59 | Admitting: Physical Therapy

## 2018-06-12 DIAGNOSIS — M62838 Other muscle spasm: Secondary | ICD-10-CM

## 2018-06-12 DIAGNOSIS — M791 Myalgia, unspecified site: Secondary | ICD-10-CM

## 2018-06-12 DIAGNOSIS — R293 Abnormal posture: Secondary | ICD-10-CM

## 2018-06-12 NOTE — Therapy (Addendum)
[facility] [address] [place], [place], [address] Phone: [phone]   Fax:  [phone]  Physical Therapy Treatment  Patient Details  Name: Carrie Kennedy MRN: [id number] Date of Birth: 12-25-1985 Referring Provider (PT): Eligha BridegroomMichelle Louis   Encounter Date: 06/12/2018  PT End of Session - 06/12/18 1457    Visit Number  18    Number of Visits  20    Date for PT Re-Evaluation  08/21/18    PT Start Time  1500    PT Stop Time  1600    PT Time Calculation (min)  60 min    Activity Tolerance  Patient tolerated treatment well    Behavior During Therapy  [facility] for tasks assessed/performed       Past Medical History:  Diagnosis Date  . Dysmenorrhea   . Hyperlipidemia   . Hypertension   . Tachycardia   . Vulvodynia     Past Surgical History:  Procedure Laterality Date  . EAR      TUBES    There were no vitals filed for this visit.  Subjective Assessment - 06/13/18 1313    Subjective Pt reports 30-40% less pain in her neck and shoulders. Pt wakes up with less pain and is sleeping better.  Pt as been walking 2-3 x a week. Pt prefer fitness classes ( yoga, pilates, [facility] boxing [facility]           [facility] - 06/13/18 1313      Observation/Other Assessments   Observations  poor alignment of UE/LE kinetic chain in yoga poses, improved with cues       Other:   Other/ Comments  reported no "crunching" sounds with shoulder abuduction from 0-180 deg B compared to previous sessions                    [facility] Adult PT Treatment/Exercise - 06/13/18 1315      Therapeutic Activites    Other Therapeutic Activities  yoga sequence to stretch hamstrings, pelvic mm, adductors, paraspinasls, education on how to modify yoga poses when attending gym classes to accommdate for scoliosis       Neuro Re-ed    Neuro Re-ed Details   cued for propioception of feet, knee, hips in standing yoga poses                 PT Short Term Goals - 03/20/18 [id number]      PT SHORT TERM GOAL #1   Title  Patient will demonstrate improved sitting and standing posture to demonstrate learning and decrease stress on the pelvic floor with functional activity.    Baseline  Anterior pelvic ,     Time  6    Period  Weeks    Status  Achieved    Target Date  05/22/18      PT SHORT TERM GOAL #2   Title  Patient will demonstrate improved pelvic alignment and balance of musculature surrounding the pelvis to facilitate decreased PFM spasms and decrease pelvic pain.    Baseline  L up-slip, R leg-long    Time  6    Period  Weeks    Status  Achieved    Target Date  11/29/18      PT SHORT TERM GOAL #3   Title  Patient will report a reduction in pain to no greater than 1/10 over the prior week to demonstrate symptom improvement.    Baseline  3/10 over past week (debilitating during infection), As of 1-31: Max of 2/10    Time  6    Period  Weeks    Status  On-going    Target Date  05/22/18        PT Long Term Goals - 04/10/18 8119      PT LONG TERM GOAL #1   Title  Patient will report no pain with intercourse or other vaginal penetration to demonstrate improved functional ability.    Baseline  Pain with initial penetration    Time  12    Period  Weeks    Status  Unable to assess      PT LONG TERM GOAL #2   Title  Patient will score less than or equal to 15% on the Female NIH-CPSI to demonstrate a reduction in pain, urinary symptoms, and an improved quality of life.    Baseline  Female NIH-CPSI: 14/43 (33%),  VQ: 11/33 (33%) As of 2/14: VQ: 7/33 (15%), Female NIH-CPSI: 5/43 (%),  As of 3/6: 0%)     Time  12    Period  Weeks    Status  Achieved      PT LONG TERM GOAL #3   Title  Patient will be independent with HEP and stress-management routines in order to return to PLOF.    Time  12    Period  Weeks    Status  Achieved      PT LONG TERM GOAL #4   Title  Patient will describe pelvic pain no  greater than 0/10 during normal daily activities to demonstrate improved functional ability.    Baseline  3/10 max, as of 2/14: 2/10   , as of 3/6:  0/10 ( switching medication to Cymbalta has helped)     Time  12    Period  Weeks    Status  Achieved      PT LONG TERM GOAL #5   Title  Pt will demo decreased mm tightness at R T/L junction with less posterior rotation of trunk in order to minimzie worsening of rotaional component to scoliosis     Time  4    Period  Weeks    Status  New            Plan - 06/13/18 1315    Clinical Impression Statement  Pt continues to manage her neck and shoulder pain with HEP. Pt reports 30-40% less pain in her neck and shoulders. Pt demonstrated today smoother movement of scapula B which is an improvement. Pt wakes up with less pain and is sleeping better.  Pt as been walking 2-3 x a week. Pt prefers fitness classes ( yoga, pilates, kick boxing at the [facility]) and today's session was focused on creating customized yoga sequence with physical-therapy based principles to minimize relapse of overactivity of her mm in back, quads, adductors, hamstrings. Instructions were provided on how to modify yoga poses to scoliosis when she returns to community yoga classes at her gym. Pt required cues for proper alignment in her UE/ LE kinetic chain to minimize risk for injuries. Pt demo'd improved alignment and more comfort with verbal and visual cues.   Provided telehealth session today.DPT connected with pt today by Webex video conference and verified that pt as the correct person using two identifiers. DPT discussed the limitations, risks, security and privacy concerns of performing an evaluation and management service by Webex and the availability of in person appointments.  DPT also discussed  with the patient that there may be a patient responsible charge related to this service. The patient expressed understanding and agreed to proceed. The patient's address was  confirmed.  Identified to the patient that therapist is a licensed DPT in the state of [place].Verified phone #  to call in case of technical difficulties.  Discussed decreasing frequency to every other week and possibility of d/c after next visit. Plan to educate on safe return to fitness classes at her gym.       Rehab Potential  Good    PT Frequency  1x / week    PT Duration  12 weeks    PT Treatment/Interventions  ADLs/Self Care Home Management;Biofeedback;Electrical Stimulation;Moist Heat;Functional mobility training;Therapeutic activities;Therapeutic exercise;Balance training;Patient/family education;Neuromuscular re-education;Manual techniques;Passive range of motion;Taping;Dry needling;Spinal Manipulations;Joint Manipulations    PT Next Visit Plan  Internal TP release and coccyx mobs out of flexion and/or work to decrease hip-flexor spasm/tension, review rib-flare correction and progress posterior pelvic tilts to mini-marches.    PT Home Exercise Plan  Bird-dog, piriformis stretch, child's pose, kegels, diaphragmatic breathing, hip-flexor stretch, side-stretch, bow-and-arrow, side-plank, knee plank, hip ext, wall squats, monster walks, seated and standing hip-flexor stretch, neural flossing for RLE, chest-press to decrease rib-flare.Marland Kitchen    Consulted and Agree with Plan of Care  Patient       Patient will benefit from skilled therapeutic intervention in order to improve the following deficits and impairments:  Increased fascial restricitons, Improper body mechanics, Pain, Hypermobility, Increased muscle spasms, Postural dysfunction, Decreased activity tolerance, Decreased range of motion, Decreased balance  Visit Diagnosis: Other muscle spasm  Abnormal posture  Myalgia     Problem List Patient Active Problem List   Diagnosis Date Noted  . Tachycardia   . Dysmenorrhea   . Vulvodynia   . Hypertension 08/26/2016    Mariane Masters ,PT, DPT, E-RYT  06/13/2018, 1:16 PM  [facility] MAIN [facility] SERVICES [address] [place], [place], [address] Phone: [phone]   Fax:  [phone]  Name: Carrie Kennedy MRN: [id number] Date of Birth: 15-Jul-1985

## 2018-06-26 ENCOUNTER — Ambulatory Visit: Payer: 59 | Admitting: Physical Therapy

## 2018-06-26 ENCOUNTER — Other Ambulatory Visit: Payer: Self-pay

## 2018-06-26 DIAGNOSIS — R293 Abnormal posture: Secondary | ICD-10-CM

## 2018-06-26 DIAGNOSIS — M791 Myalgia, unspecified site: Secondary | ICD-10-CM

## 2018-06-26 DIAGNOSIS — M62838 Other muscle spasm: Secondary | ICD-10-CM | POA: Diagnosis not present

## 2018-06-26 NOTE — Therapy (Signed)
[place] [address] [place], [place], [address] Phone: [phone]   Fax:  [phone]  Physical Therapy Treatment  Patient Details  Name: Carrie DionesKari M Mccolgan MRN: [id number] Date of Birth: [birth year] Referring Provider (PT): Eligha BridegroomMichelle Louis   Encounter Date: 06/26/2018  PT End of Session - 06/26/18 1323    Visit Number  19    Number of Visits  20    Date for PT Re-Evaluation  08/21/18    PT Start Time  0815    PT Stop Time  0900    PT Time Calculation (min)  45 min    Activity Tolerance  Patient tolerated treatment well    Behavior During Therapy  [facility] for tasks assessed/performed       Past Medical History:  Diagnosis Date  . Dysmenorrhea   . Hyperlipidemia   . Hypertension   . Tachycardia   . Vulvodynia     Past Surgical History:  Procedure Laterality Date  . EAR      TUBES    There were no vitals filed for this visit.  Subjective Assessment - 06/26/18 0839    Subjective  Pt will be returning to full schedule next week. Pt is not going to be in her body when she goes back to regular schedule. On a lighter schedule, pt has not had any aches/ pains.  Pt also continues to sleep well without pain.          [facility] - 06/26/18 1316      Observation/Other Assessments   Observations  less forward head posture       Other:   Other/ Comments  poor posture and alignment with use of dumbbells                    [facility] Adult PT Treatment/Exercise - 06/26/18 1318      Neuro Re-ed    Neuro Re-ed Details   cues for alignment when using #3 dumbbells in bicep, tricept curls, overhead triceps, modifications with deltoids                PT Short Term Goals - 03/20/18 [id number]      PT SHORT TERM GOAL #1   Title  Patient will demonstrate improved sitting and standing posture to demonstrate learning and decrease stress on the pelvic floor with functional activity.    Baseline  Anterior  pelvic ,     Time  6    Period  Weeks    Status  Achieved    Target Date  05/22/18      PT SHORT TERM GOAL #2   Title  Patient will demonstrate improved pelvic alignment and balance of musculature surrounding the pelvis to facilitate decreased PFM spasms and decrease pelvic pain.    Baseline  L up-slip, R leg-long    Time  6    Period  Weeks    Status  Achieved    Target Date  11/29/18      PT SHORT TERM GOAL #3   Title  Patient will report a reduction in pain to no greater than 1/10 over the prior week to demonstrate symptom improvement.    Baseline  3/10 over past week (debilitating during infection), As of 1-31: Max of 2/10    Time  6    Period  Weeks    Status  On-going    Target Date  05/22/18  PT Long Term Goals - 06/26/18 0843      PT LONG TERM GOAL #1   Title  Patient will report no pain with intercourse or other vaginal penetration to demonstrate improved functional ability.    Baseline  Pain with initial penetration    Time  12    Period  Weeks    Status  Achieved      PT LONG TERM GOAL #2   Title  Patient will score less than or equal to 15% on the Female NIH-CPSI to demonstrate a reduction in pain, urinary symptoms, and an improved quality of life.    Baseline  Female NIH-CPSI: 14/43 (33%),  VQ: 11/33 (33%) As of 2/14: VQ: 7/33 (15%), Female NIH-CPSI: 5/43 (%),  As of 3/6: 0%)     Time  12    Period  Weeks    Status  Achieved      PT LONG TERM GOAL #3   Title  Patient will be independent with HEP and stress-management routines in order to return to PLOF.    Time  12    Period  Weeks    Status  Achieved      PT LONG TERM GOAL #4   Title  Patient will describe pelvic pain no greater than 0/10 during normal daily activities to demonstrate improved functional ability.    Baseline  3/10 max, as of 2/14: 2/10   , as of 3/6:  0/10 ( switching medication to Cymbalta has helped)     Time  12    Period  Weeks    Status  Achieved      PT LONG TERM GOAL #5    Title  Pt will demo decreased mm tightness at R T/L junction with less posterior rotation of trunk in order to minimzie worsening of rotaional component to scoliosis     Time  4    Period  Weeks    Status  Achieved      Additional Long Term Goals   Additional Long Term Goals  Yes      PT LONG TERM GOAL #6   Title  Pt will be IND with use of dumbbells and resistance bands with minimize risk for injuries to return to fitness and to compliment repeated lifting at work.     Time  10    Period  Weeks    Status  New    Target Date  09/04/18            Plan - 06/26/18 1323    Clinical Impression Statement Pt demo'd less forward head posture and good carry over with scapular stabilization with dumbbells use.  Progressing pt to proper dumbbell use and education for future once a month session to use resistance bands. Modified dumbbell exercise to minimize risk for injuries, educated about exercises for rotator cuff strengthening to minimize repeated lifting injuries at work. Pt continues to benefit from skilled PT to once a month for safety and return to fitness.      Provided telehealth session today.DPT connected with pt today by Webex video conference and verified that pt as the correct person using two identifiers. DPT discussed the limitations, risks, security and privacy concerns of performing an evaluation and management service by Webex and the availability of in person appointments.  DPT also discussed with the patient that there may be a patient responsible charge related to this service. The patient expressed understanding and agreed to proceed.  The patient's address was  confirmed.  Identified to the patient that therapist is a licensed DPT in the state of [place].Verified phone #  to call in case of technical difficulties.       Rehab Potential  Good    PT Frequency  1x / week    PT Duration  12 weeks    PT Treatment/Interventions  ADLs/Self Care Home  Management;Biofeedback;Electrical Stimulation;Moist Heat;Functional mobility training;Therapeutic activities;Therapeutic exercise;Balance training;Patient/family education;Neuromuscular re-education;Manual techniques;Passive range of motion;Taping;Dry needling;Spinal Manipulations;Joint Manipulations    PT Next Visit Plan  Internal TP release and coccyx mobs out of flexion and/or work to decrease hip-flexor spasm/tension, review rib-flare correction and progress posterior pelvic tilts to mini-marches.    PT Home Exercise Plan  Bird-dog, piriformis stretch, child's pose, kegels, diaphragmatic breathing, hip-flexor stretch, side-stretch, bow-and-arrow, side-plank, knee plank, hip ext, wall squats, monster walks, seated and standing hip-flexor stretch, neural flossing for RLE, chest-press to decrease rib-flare.Marland Kitchen    Consulted and Agree with Plan of Care  Patient       Patient will benefit from skilled therapeutic intervention in order to improve the following deficits and impairments:  Increased fascial restricitons, Improper body mechanics, Pain, Hypermobility, Increased muscle spasms, Postural dysfunction, Decreased activity tolerance, Decreased range of motion, Decreased balance  Visit Diagnosis: Other muscle spasm  Abnormal posture  Myalgia     Problem List Patient Active Problem List   Diagnosis Date Noted  . Tachycardia   . Dysmenorrhea   . Vulvodynia   . Hypertension 08/26/2016    Mariane Masters ,PT, DPT, E-RYT  06/26/2018, 1:28 PM  [place] [facility] MAIN [facility] SERVICES [address] [place], [place], [address] Phone: [phone]   Fax:  [phone]  Name: MAJESTI GAMBRELL MRN: [id number] Date of Birth: 06-Mar-1985

## 2018-06-26 NOTE — Patient Instructions (Signed)
Dumbbell technique with bicep curl tricep curl over bench, and overhead  shoulder abduction to 30 deg not to 90deg  Rotator cuff circles ( lower)    Use 3# weights for 2 months, then 5#     Maintain stretches and a rest break upon returning back from work ( legs elevated on couch 10 min when work schedule ramps up)    [email or web address] bands to order for more strengthening

## 2018-07-03 ENCOUNTER — Encounter: Payer: 59 | Admitting: Physical Therapy

## 2018-07-10 ENCOUNTER — Encounter: Payer: 59 | Admitting: Physical Therapy

## 2018-07-10 ENCOUNTER — Other Ambulatory Visit: Payer: Self-pay

## 2018-07-10 ENCOUNTER — Ambulatory Visit: Payer: 59 | Attending: Obstetrics & Gynecology | Admitting: Physical Therapy

## 2018-07-10 DIAGNOSIS — R293 Abnormal posture: Secondary | ICD-10-CM

## 2018-07-10 DIAGNOSIS — M62838 Other muscle spasm: Secondary | ICD-10-CM | POA: Insufficient documentation

## 2018-07-10 DIAGNOSIS — M791 Myalgia, unspecified site: Secondary | ICD-10-CM | POA: Insufficient documentation

## 2018-07-10 NOTE — Therapy (Addendum)
[place] MAIN [facility] SERVICES [address] [place], [place], [address] Phone: [phone]   Fax:  [phone]  Physical Therapy Treatment  Patient Details  Name: Carrie Kennedy MRN: [id number] Date of Birth: 1985-11-28 Referring Provider (PT): Hal Neer   Encounter Date: 07/10/2018  PT End of Session - 07/14/18 1027    Visit Number  20    Date for PT Re-Evaluation  08/21/18    PT Start Time  0815    PT Stop Time  0945    PT Time Calculation (min)  90 min    Activity Tolerance  Patient tolerated treatment well    Behavior During Therapy  [facility] for tasks assessed/performed       Past Medical History:  Diagnosis Date  . Dysmenorrhea   . Hyperlipidemia   . Hypertension   . Tachycardia   . Vulvodynia     Past Surgical History:  Procedure Laterality Date  . EAR      TUBES    There were no vitals filed for this visit.  Subjective Assessment - 07/14/18 1021    Subjective  Pt reported her back did bother her and she sat in her massage chair which resolved it.          [facility] PT Assessment - 07/14/18 1021      Coordination   Gross Motor Movements are Fluid and Coordinated  --   no cues for scapular stabilization , no upper trap overuse     Lunges   Comments  poor knee alignment. co-activation of lower kinetic chain       Palpation   Palpation comment  Limited mobility at midfoot B, tight trasnverse arch/ plantar -dorsal interossei      Ambulation/Gait   Gait Comments  genu valgus, narrow BOS, limited hip ext, pronation. wears flipflops , shoes with poor support                    [facility] Adult PT Treatment/Exercise - 07/14/18 1021      Neuro Re-ed    Neuro Re-ed Details   cued for gait , increased stride, less heel striking. cued for lower kinetic chain co-activation, proper knee alignment        Manual Therapy   Manual therapy comments  Grade III mob midfoot joints to facilitate DF/EV, STM intrinsic mm                  PT Short Term Goals - 03/20/18 9798      PT SHORT TERM GOAL #1   Title  Patient will demonstrate improved sitting and standing posture to demonstrate learning and decrease stress on the pelvic floor with functional activity.    Baseline  Anterior pelvic ,     Time  6    Period  Weeks    Status  Achieved    Target Date  05/22/18      PT SHORT TERM GOAL #2   Title  Patient will demonstrate improved pelvic alignment and balance of musculature surrounding the pelvis to facilitate decreased PFM spasms and decrease pelvic pain.    Baseline  L up-slip, R leg-long    Time  6    Period  Weeks    Status  Achieved    Target Date  11/29/18      PT SHORT TERM GOAL #3   Title  Patient will report a reduction in pain to no greater than 1/10 over the prior week  to demonstrate symptom improvement.    Baseline  3/10 over past week (debilitating during infection), As of 1-31: Max of 2/10    Time  6    Period  Weeks    Status  On-going    Target Date  05/22/18        PT Long Term Goals - 07/10/18 4259      PT LONG TERM GOAL #1   Title  Patient will report no pain with intercourse or other vaginal penetration to demonstrate improved functional ability.    Baseline  Pain with initial penetration    Time  12    Period  Weeks    Status  Achieved      PT LONG TERM GOAL #2   Title  Patient will score less than or equal to 15% on the Female NIH-CPSI to demonstrate a reduction in pain, urinary symptoms, and an improved quality of life.    Baseline  Female NIH-CPSI: 14/43 (33%),  VQ: 11/33 (33%) As of 2/14: VQ: 7/33 (15%), Female NIH-CPSI: 5/43 (%),  As of 3/6: 0%)     Time  12    Period  Weeks    Status  Achieved      PT LONG TERM GOAL #3   Title  Patient will be independent with HEP and stress-management routines in order to return to PLOF.    Time  12    Period  Weeks    Status  Achieved      PT LONG TERM GOAL #4   Title  Patient will describe pelvic pain no greater  than 0/10 during normal daily activities to demonstrate improved functional ability.    Baseline  3/10 max, as of 2/14: 2/10   , as of 3/6:  0/10 ( switching medication to Cymbalta has helped)     Time  12    Period  Weeks    Status  Achieved      PT LONG TERM GOAL #5   Title  Pt will demo decreased mm tightness at R T/L junction with less posterior rotation of trunk in order to minimzie worsening of rotaional component to scoliosis     Time  4    Period  Weeks    Status  Achieved      PT LONG TERM GOAL #6   Title  Pt will be IND with use of dumbbells and resistance bands with minimize risk for injuries to return to fitness and to compliment repeated lifting at work.     Time  10    Period  Weeks    Status  Partially Met            Plan - 07/14/18 1028    Clinical Impression Statement  Pt demo'd good carry over with spinal mobility, improved scapular stabilization with band exercises. Addressed lower kinetic chain to improve genu valgus, pronation, limited midfoot joints and to increased stride in gait.  Pt will benefit from regional interdependent approach to yield long lasting changes to pelvic mm overactivity/ SIJ hypomobility.    Pt continues to benefit from skilled PT.    Rehab Potential  Good    PT Frequency  1x / week    PT Duration  12 weeks    PT Treatment/Interventions  ADLs/Self Care Home Management;Biofeedback;Electrical Stimulation;Moist Heat;Functional mobility training;Therapeutic activities;Therapeutic exercise;Balance training;Patient/family education;Neuromuscular re-education;Manual techniques;Passive range of motion;Taping;Dry needling;Spinal Manipulations;Joint Manipulations    PT Next Visit Plan  Internal TP release and coccyx mobs out of  flexion and/or work to decrease hip-flexor spasm/tension, review rib-flare correction and progress posterior pelvic tilts to mini-marches.    PT Home Exercise Plan  Bird-dog, piriformis stretch, child's pose, kegels,  diaphragmatic breathing, hip-flexor stretch, side-stretch, bow-and-arrow, side-plank, knee plank, hip ext, wall squats, monster walks, seated and standing hip-flexor stretch, neural flossing for RLE, chest-press to decrease rib-flare.Marland Kitchen    Consulted and Agree with Plan of Care  Patient       Patient will benefit from skilled therapeutic intervention in order to improve the following deficits and impairments:  Increased fascial restricitons, Improper body mechanics, Pain, Hypermobility, Increased muscle spasms, Postural dysfunction, Decreased activity tolerance, Decreased range of motion, Decreased balance  Visit Diagnosis: Other muscle spasm  Abnormal posture  Myalgia     Problem List Patient Active Problem List   Diagnosis Date Noted  . Tachycardia   . Dysmenorrhea   . Vulvodynia   . Hypertension 08/26/2016    Jerl Mina ,PT, DPT, E-RYT  07/14/2018, 10:28 AM  [place] MAIN [facility] SERVICES [address] [place], [place], [address] Phone: [phone]   Fax:  [phone]  Name: VALISA KARPEL MRN: [id number] Date of Birth: December 13, 1985

## 2018-07-13 NOTE — Patient Instructions (Signed)
Blue bands, with proper placement of feet in ski track stance 1) lawmover, 10 reps x 2  2) seatbelt 10 reps x 2  3) "w" pull downs in mini squat  10 x 2reps)  4) lunge stance: triceps   Walking with higher knees, landing on ballmounds more , less with less, notice belly button over shins longer strides to decrease pelvic floor tightness   spread toes in pedicure spacer 30 min when resting , watching TV  Consider alternative to flip flops. danskos, Toms.  Sandals with  back heel strap Shoes with flexible soles and proper support

## 2018-07-14 ENCOUNTER — Ambulatory Visit: Payer: BLUE CROSS/BLUE SHIELD | Admitting: Obstetrics and Gynecology

## 2018-07-16 ENCOUNTER — Ambulatory Visit: Payer: BLUE CROSS/BLUE SHIELD | Admitting: Obstetrics and Gynecology

## 2018-07-31 ENCOUNTER — Other Ambulatory Visit: Payer: Self-pay

## 2018-07-31 ENCOUNTER — Ambulatory Visit: Payer: 59 | Admitting: Physical Therapy

## 2018-07-31 DIAGNOSIS — M62838 Other muscle spasm: Secondary | ICD-10-CM | POA: Diagnosis not present

## 2018-07-31 DIAGNOSIS — M791 Myalgia, unspecified site: Secondary | ICD-10-CM

## 2018-07-31 DIAGNOSIS — R293 Abnormal posture: Secondary | ICD-10-CM

## 2018-07-31 NOTE — Therapy (Signed)
[place] MAIN [employer] SERVICES [address] [place], [place], [address] Phone: [phone]   Fax:  [phone]  Physical Therapy Treatment  Patient Details  Name: Carrie Kennedy MRN: [id number] Date of Birth: [birth year] Referring Provider (PT): Hal Neer   Encounter Date: 07/31/2018  PT End of Session - 07/31/18 1012    Visit Number  21    Date for PT Re-Evaluation  08/21/18    PT Start Time  0928    PT Stop Time  1006    PT Time Calculation (min)  38 min    Activity Tolerance  Patient tolerated treatment well    Behavior During Therapy  [facility] for tasks assessed/performed       Past Medical History:  Diagnosis Date  . Dysmenorrhea   . Hyperlipidemia   . Hypertension   . Tachycardia   . Vulvodynia     Past Surgical History:  Procedure Laterality Date  . EAR      TUBES    There were no vitals filed for this visit.  Subjective Assessment - 07/31/18 0933    Subjective  Pt returned to a regular schedule at work with repeated lifting and it not having any back at the end         [facility] PT Assessment - 07/31/18 0938      Coordination   Gross Motor Movements are Fluid and Coordinated  --   feet/knee proprioception poor, required cues                   [facility] Adult PT Treatment/Exercise - 07/31/18 3419      Neuro Re-ed    Neuro Re-ed Details   cued for feet/knee hip alignement in new exercises ( see pt isntruciton for cues and new exercises)                PT Short Term Goals - 03/20/18 3790      PT SHORT TERM GOAL #1   Title  Patient will demonstrate improved sitting and standing posture to demonstrate learning and decrease stress on the pelvic floor with functional activity.    Baseline  Anterior pelvic ,     Time  6    Period  Weeks    Status  Achieved    Target Date  05/22/18      PT SHORT TERM GOAL #2   Title  Patient will demonstrate improved pelvic alignment and balance of musculature  surrounding the pelvis to facilitate decreased PFM spasms and decrease pelvic pain.    Baseline  L up-slip, R leg-long    Time  6    Period  Weeks    Status  Achieved    Target Date  11/29/18      PT SHORT TERM GOAL #3   Title  Patient will report a reduction in pain to no greater than 1/10 over the prior week to demonstrate symptom improvement.    Baseline  3/10 over past week (debilitating during infection), As of 1-31: Max of 2/10    Time  6    Period  Weeks    Status  On-going    Target Date  05/22/18        PT Long Term Goals - 07/31/18 0933      PT LONG TERM GOAL #1   Title  Patient will report no pain with intercourse or other vaginal penetration to demonstrate improved functional ability.    Baseline  Pain with  initial penetration    Time  12    Period  Weeks    Status  Achieved      PT LONG TERM GOAL #2   Title  Patient will score less than or equal to 15% on the Female NIH-CPSI to demonstrate a reduction in pain, urinary symptoms, and an improved quality of life.    Baseline  Female NIH-CPSI: 14/43 (33%),  VQ: 11/33 (33%) As of 2/14: VQ: 7/33 (15%), Female NIH-CPSI: 5/43 (%),  As of 3/6: 0%)     Time  12    Period  Weeks    Status  Achieved      PT LONG TERM GOAL #3   Title  Patient will be independent with HEP and stress-management routines in order to return to PLOF.    Time  12    Period  Weeks    Status  Achieved      PT LONG TERM GOAL #4   Title  Patient will describe pelvic pain no greater than 0/10 during normal daily activities to demonstrate improved functional ability.    Baseline  3/10 max, as of 2/14: 2/10   , as of 3/6:  0/10 ( switching medication to Cymbalta has helped)     Time  12    Period  Weeks    Status  Achieved      PT LONG TERM GOAL #5   Title  Pt will demo decreased mm tightness at R T/L junction with less posterior rotation of trunk in order to minimzie worsening of rotaional component to scoliosis     Time  4    Period  Weeks     Status  Achieved      PT LONG TERM GOAL #6   Title  Pt will be IND with use of dumbbells and resistance bands with minimize risk for injuries to return to fitness and to compliment repeated lifting at work.     Time  10    Period  Weeks    Status  Partially Met            Plan - 07/31/18 1020    Clinical Impression Statement Pt continues to have resolved back pain after working long hours even as her work schedule has return to more full time hours.  Pt demo'd less cuing for upper trap overuse with resistance bands but required more cues for proper lower kinetic chain alignment. Pt's tendency to hyperextension knees and stand in ballet stance was corrected with modification and education to minimize relapse of overactivityof pelvic floor mm. Progressed with more global mm strengthening today. Pt shows significantly improved upright posture and less mm tensions. Pt continues to benefit from skilled PT. Changing frequency to once a month as pt has entered fitness phase of rehab to help pt return to fitness.    Rehab Potential  Good    PT Frequency  1x / month    PT Duration  12 weeks    PT Treatment/Interventions  ADLs/Self Care Home Management;Biofeedback;Electrical Stimulation;Moist Heat;Functional mobility training;Therapeutic activities;Therapeutic exercise;Balance training;Patient/family education;Neuromuscular re-education;Manual techniques;Passive range of motion;Taping;Dry needling;Spinal Manipulations;Joint Manipulations    PT Next Visit Plan  Internal TP release and coccyx mobs out of flexion and/or work to decrease hip-flexor spasm/tension, review rib-flare correction and progress posterior pelvic tilts to mini-marches.    PT Home Exercise Plan  Bird-dog, piriformis stretch, child's pose, kegels, diaphragmatic breathing, hip-flexor stretch, side-stretch, bow-and-arrow, side-plank, knee plank, hip ext, wall squats, monster walks, seated  and standing hip-flexor stretch, neural flossing  for RLE, chest-press to decrease rib-flare.Marland Kitchen    Consulted and Agree with Plan of Care  Patient       Patient will benefit from skilled therapeutic intervention in order to improve the following deficits and impairments:  Increased fascial restricitons, Improper body mechanics, Pain, Hypermobility, Increased muscle spasms, Postural dysfunction, Decreased activity tolerance, Decreased range of motion, Decreased balance  Visit Diagnosis: 1. Abnormal posture   2. Other muscle spasm   3. Myalgia        Problem List Patient Active Problem List   Diagnosis Date Noted  . Tachycardia   . Dysmenorrhea   . Vulvodynia   . Hypertension 08/26/2016    Jerl Mina ,PT, DPT, E-RYT  07/31/2018, 10:29 AM  [place] MAIN [facility] SERVICES [address] [place], [place], [address] Phone: [phone]   Fax:  [phone]  Name: Carrie Kennedy MRN: [id number] Date of Birth: December 17, 1985

## 2018-07-31 NOTE — Patient Instructions (Addendum)
Staying with facing away from door with bands over door   1) "Mountain hiker throwing"  - band   Lunge position, tes point forward, trunk leans to create diagonal line with back leg)  Ski track stance, band under back foot,  50% weight on front leg (knee above ankle)  50% weight on back leg   Band are against buttocks and band Elbows point to the sky, hands holding band   Inhale, exhale extend elbows like you are throwing something overhead,  10 reps on each leg  X 2 sets    2) "Hulk Hogan"  20 reps   ___   Bands under door   Facing door Chest rows  Towel under knees to not hyperextend Shoulders down and back, lean trunk back   20reps   Alternating knee press  Press ballmounds into strap, knee above hips, pressing arms/shoulders back of head back to floor Inhale, exhale then push strap Inhale return knee above hips   makes sure to not have back arch  20 reps  __ Pelvic floor stretches ( series) Foot in, leg extended, heel down toes up Lean in Hug ball Open ball Rainbow arch, high five sky

## 2018-08-21 ENCOUNTER — Ambulatory Visit: Payer: 59 | Attending: Obstetrics & Gynecology | Admitting: Physical Therapy

## 2018-08-21 ENCOUNTER — Other Ambulatory Visit: Payer: Self-pay

## 2018-08-21 DIAGNOSIS — R293 Abnormal posture: Secondary | ICD-10-CM | POA: Insufficient documentation

## 2018-08-21 DIAGNOSIS — M791 Myalgia, unspecified site: Secondary | ICD-10-CM

## 2018-08-21 DIAGNOSIS — M62838 Other muscle spasm: Secondary | ICD-10-CM | POA: Diagnosis present

## 2018-08-21 NOTE — Patient Instructions (Addendum)
Lower exercises with bands:  _birddog facing door, Bands on ballmound  Stabilization of hands / opp leg ( toes tucked, knees placed hip width apart)  Extend knee, ankle at 90 deg  Part 2: add opp arm, elbow extended, thumb up and out, shoulders squeezed together and down   _standing at 45 deg , R arm closest to wall for support  Band on L foot, ballmound, R knee slightly bent, pointed along 2-3 toes,  Step L ballmound back diagonally while R leg stabilizes   _Part 2: add triceps ( elbow by rib ) or elb extended  * repeat on other side   _sidelying shoulder rotator cuff circles ( ER) with hip circles ( foot back)

## 2018-08-21 NOTE — Therapy (Addendum)
[place] MAIN [facility] SERVICES [address] [place], [place], [address] Phone: [phone]   Fax:  [phone]  Physical Therapy Treatment / Progress Note    Patient Details  Name: Carrie Kennedy MRN: [id number] Date of Birth: 1985-05-02 Referring Provider (PT): Paul Dykes, MD   Encounter Date: 08/21/2018  PT End of Session - 08/21/18 1012    Visit Number  22    Date for PT Re-Evaluation 01/07/19    PT Start Time  0800    PT Stop Time  0900    PT Time Calculation (min)  60 min    Activity Tolerance  Patient tolerated treatment well    Behavior During Therapy  [facility] for tasks assessed/performed       Past Medical History:  Diagnosis Date  . Dysmenorrhea   . Hyperlipidemia   . Hypertension   . Tachycardia   . Vulvodynia     Past Surgical History:  Procedure Laterality Date  . EAR      TUBES    There were no vitals filed for this visit.  Subjective Assessment - 08/21/18 1002    Subjective  Pt had no difficulties with new exercises with Handibands         [facility] PT Assessment - 08/21/18 1004      Observation/Other Assessments   Observations  toe flexion/ no transverse co-activation for single balance, medial collpase of L ankle > R       Other:   Other/ Comments  less cues for pelvic stability/ scapular stabilization                   [facility] Adult PT Treatment/Exercise - 08/21/18 1003      Therapeutic Activites    Other Therapeutic Activities  relaxation training, pelvic floor lengthening. motivational interviewing, active listening provided        Neuro Re-ed    Neuro Re-ed Details   cued for toe abduction/ transverse arch, decreasing medial collapse. cued for stabilization principles with new LE strengthening                PT Short Term Goals - 03/20/18 5027      PT SHORT TERM GOAL #1   Title  Patient will demonstrate improved sitting and standing posture to demonstrate learning and decrease  stress on the pelvic floor with functional activity.    Baseline  Anterior pelvic ,     Time  6    Period  Weeks    Status  Achieved    Target Date  05/22/18      PT SHORT TERM GOAL #2   Title  Patient will demonstrate improved pelvic alignment and balance of musculature surrounding the pelvis to facilitate decreased PFM spasms and decrease pelvic pain.    Baseline  L up-slip, R leg-long    Time  6    Period  Weeks    Status  Achieved    Target Date  11/29/18      PT SHORT TERM GOAL #3   Title  Patient will report a reduction in pain to no greater than 1/10 over the prior week to demonstrate symptom improvement.    Baseline  3/10 over past week (debilitating during infection), As of 1-31: Max of 2/10    Time  6    Period  Weeks    Status  On-going    Target Date  05/22/18          PT  Long Term Goals - 08/21/18 1014      PT LONG TERM GOAL #1   Title  Patient will report no pain with intercourse or other vaginal penetration to demonstrate improved functional ability.    Baseline  Pain with initial penetration    Time  12    Period  Weeks    Status  Achieved      PT LONG TERM GOAL #2   Title  Patient will score less than or equal to 15% on the Female NIH-CPSI to demonstrate a reduction in pain, urinary symptoms, and an improved quality of life.    Baseline  Female NIH-CPSI: 14/43 (33%),  VQ: 11/33 (33%) As of 2/14: VQ: 7/33 (15%), Female NIH-CPSI: 5/43 (%),  As of 3/6: 0%)     Time  12    Period  Weeks    Status  Achieved      PT LONG TERM GOAL #3   Title  Patient will be independent with HEP and stress-management routines in order to return to PLOF.    Time  12    Period  Weeks    Status  Achieved      PT LONG TERM GOAL #4   Title  Patient will describe pelvic pain no greater than 0/10 during normal daily activities to demonstrate improved functional ability.    Baseline  3/10 max, as of 2/14: 2/10   , as of 3/6:  0/10 ( switching medication to Cymbalta has helped)      Time  12    Period  Weeks    Status  Achieved      PT LONG TERM GOAL #5   Title  Pt will demo decreased mm tightness at R T/L junction with less posterior rotation of trunk in order to minimzie worsening of rotaional component to scoliosis     Time  4    Period  Weeks    Status  Achieved      Additional Long Term Goals   Additional Long Term Goals  Yes      PT LONG TERM GOAL #6   Title  Pt will be IND with use of dumbbells and resistance bands with minimize risk for injuries to return to fitness and to compliment repeated lifting at work.     Time  10    Period  Weeks    Status  Partially Met      PT LONG TERM GOAL #7   Title  Pt will report management of mm tensions in back and pelvic floor with work and lifting at work across 3 months in order to have a self84mnagement of pain    Time  20    Period  Weeks    Status  New    Target Date  01/08/19          Plan - 08/21/18 1014    Clinical Impression Statement  Pt has achieved 5/7 goals and is progressing well with goals related to fitness and self-management of pain. Pt demo'd significantly improved upright posture, stronger deep core and thoracolumbar mm, and reported signficantly decreased back and pelvic pain. Pt's POC is decreasing to once a month to help pt learn how to self-management more and be able to engage in fitness exercises with minimize risk for relapse of pain.  Pt continues to benefit skilled PT.    Rehab Potential  Good    PT Frequency  Monthy    PT Duration  20 weeks  PT Treatment/Interventions  ADLs/Self Care Home Management;Biofeedback;Electrical Stimulation;Moist Heat;Functional mobility training;Therapeutic activities;Therapeutic exercise;Balance training;Patient/family education;Neuromuscular re-education;Manual techniques;Passive range of motion;Taping;Dry needling;Spinal Manipulations;Joint Manipulations    PT Next Visit Plan    PT Home Exercise Plan     Consulted and Agree with Plan of Care   Patient       Patient will benefit from skilled therapeutic intervention in order to improve the following deficits and impairments:  Increased fascial restricitons, Improper body mechanics, Pain, Hypermobility, Increased muscle spasms, Postural dysfunction, Decreased activity tolerance, Decreased range of motion, Decreased balance  Visit Diagnosis: 1. Abnormal posture   2. Other muscle spasm   3. Myalgia        Problem List Patient Active Problem List   Diagnosis Date Noted  . Tachycardia   . Dysmenorrhea   . Vulvodynia   . Hypertension 08/26/2016    Jerl Mina ,PT, DPT, E-RYT  08/21/2018, 10:19 AM  [place] MAIN [facility] SERVICES [address] [place], [place], [address] Phone: [phone]   Fax:  [phone]  Name: Carrie Kennedy MRN: [id number] Date of Birth: 1985/03/29

## 2018-08-24 NOTE — Progress Notes (Signed)
PCP:  Gale JourneyWalsh, Catherine P, MD   Chief Complaint  Patient presents with  . Gynecologic Exam     HPI:      Ms. Carrie Kennedy is a 33 y.o. G0P0000 who LMP was Patient's last menstrual period was 08/10/2018 (exact date)., presents today for her annual examination.  Her menses are regular every 28-30 days, lasting 5 days.  Dysmenorrhea mild, occurring first 1-2 days of flow. She does not have intermenstrual bleeding.   Sex activity: not sexually active.  Last Pap: 06/05/17  Results were: no abnormalities /neg HPV DNA  Hx of STDs: none  There is a FH of breast cancer in her MGM, genetic testing not indicated. There is a FH of ovarian cancer in her mat 2nd cousin. The patient does do self-breast exams.  Tobacco use: The patient denies current or previous tobacco use. Alcohol use: social drinker No drug use.  Exercise: moderately active  She does get adequate calcium and Vitamin D in her diet.  Pt on cymbalta for vulvodynia, [facility]. Had 2 flares this yr but doing better now. Also on propranolol for HTN, followed by PCP.   Past Medical History:  Diagnosis Date  . Dysmenorrhea   . Hyperlipidemia   . Hypertension   . Tachycardia   . Vulvodynia     Past Surgical History:  Procedure Laterality Date  . EAR      TUBES    Family History  Problem Relation Age of Onset  . Hypertension Mother   . Heart disease Mother   . Prostate cancer Father   . Hypertension Father     Social History   Socioeconomic History  . Marital status: Single    Spouse name: Not on file  . Number of children: Not on file  . Years of education: Not on file  . Highest education level: Not on file  Occupational History  . Not on file  Social Needs  . Financial resource strain: Not on file  . Food insecurity    Worry: Not on file    Inability: Not on file  . Transportation needs    Medical: Not on file    Non-medical: Not on file  Tobacco Use  . Smoking status: Never Smoker   . Smokeless tobacco: Never Used  Substance and Sexual Activity  . Alcohol use: Yes  . Drug use: No  . Sexual activity: Not Currently    Birth control/protection: None  Lifestyle  . Physical activity    Days per week: Not on file    Minutes per session: Not on file  . Stress: Not on file  Relationships  . Social Musicianconnections    Talks on phone: Not on file    Gets together: Not on file    Attends religious service: Not on file    Active member of club or organization: Not on file    Attends meetings of clubs or organizations: Not on file    Relationship status: Not on file  . Intimate partner violence    Fear of current or ex partner: Not on file    Emotionally abused: Not on file    Physically abused: Not on file    Forced sexual activity: Not on file  Other Topics Concern  . Not on file  Social History Narrative  . Not on file    Outpatient Medications Prior to Visit  Medication Sig Dispense Refill  . Azelastine-Fluticasone (DYMISTA) 137-50 MCG/ACT SUSP 1 spray  by Each Nare route Two (2) times a day.    . cholecalciferol (VITAMIN D) 1000 units tablet Take 1,000 Units by mouth daily.    . DULoxetine (CYMBALTA) 60 MG capsule Take 60 mg by mouth daily.    . magnesium oxide (MAG-OX) 400 MG tablet Take by mouth.    . propranolol (INDERAL) 20 MG tablet TAKE 1 TABLET (20 MG TOTAL) BY MOUTH TWO (2) TIMES A DAY.    Marland Kitchen. azithromycin (ZITHROMAX) 250 MG tablet azithromycin 250 mg tablet  TAKE 2 TABLETS BY MOUTH TODAY, THEN TAKE 1 TABLET DAILY FOR 4 DAYS    . chlorpheniramine-HYDROcodone (TUSSIONEX) 10-8 MG/5ML SUER hydrocodone 10 mg-chlorpheniramine 8 mg/5 mL oral susp extend.rel 12hr  TAKE 2.5-5ML EVERY 12 HOURS BY MOUTH AS NEEDED FOR 7 DAYS    . DULoxetine (CYMBALTA) 20 MG capsule Take 1 capsule (20 mg total) by mouth daily. 30 capsule 1  . ferrous sulfate 325 (65 FE) MG tablet Take 325 mg by mouth daily with breakfast.    . fluticasone (FLONASE) 50 MCG/ACT nasal spray     .  hydrOXYzine (ATARAX/VISTARIL) 25 MG tablet hydroxyzine HCl 25 mg tablet    . nortriptyline (PAMELOR) 10 MG capsule Take 10 mg by mouth at bedtime.    . propranolol (INDERAL) 10 MG tablet TAKE 1 TABLET BY MOUTH 2 TIMES A DAY 60 tablet 1  . vitamin B-12 (CYANOCOBALAMIN) 1000 MCG tablet Take 1,000 mcg by mouth daily.     No facility-administered medications prior to visit.       ROS:  Review of Systems  Constitutional: Negative for fatigue, fever and unexpected weight change.  Respiratory: Negative for cough, shortness of breath and wheezing.   Cardiovascular: Negative for chest pain, palpitations and leg swelling.  Gastrointestinal: Negative for blood in stool, constipation, diarrhea, nausea and vomiting.  Endocrine: Negative for cold intolerance, heat intolerance and polyuria.  Genitourinary: Negative for dyspareunia, dysuria, flank pain, frequency, genital sores, hematuria, menstrual problem, pelvic pain, urgency, vaginal bleeding, vaginal discharge and vaginal pain.  Musculoskeletal: Negative for back pain, joint swelling and myalgias.  Skin: Negative for rash.  Neurological: Negative for dizziness, syncope, light-headedness, numbness and headaches.  Hematological: Negative for adenopathy.  Psychiatric/Behavioral: Positive for agitation and dysphoric mood. Negative for confusion, sleep disturbance and suicidal ideas. The patient is not nervous/anxious.   BREAST: No symptoms   Objective: BP 120/90   Ht 5\' 4"  (1.626 m)   Wt 178 lb 3.2 oz (80.8 kg)   LMP 08/10/2018 (Exact Date)   BMI 30.59 kg/m    Physical Exam Constitutional:      Appearance: She is well-developed.  Genitourinary:     Vulva, vagina, uterus, right adnexa and left adnexa normal.     No vulval lesion or tenderness noted.     No vaginal discharge, erythema or tenderness.     No cervical motion tenderness or polyp.     Uterus is not enlarged or tender.     No right or left adnexal mass present.     Right  adnexa not tender.     Left adnexa not tender.  Neck:     Musculoskeletal: Normal range of motion.     Thyroid: No thyromegaly.  Cardiovascular:     Rate and Rhythm: Normal rate and regular rhythm.     Heart sounds: Normal heart sounds. No murmur.  Pulmonary:     Effort: Pulmonary effort is normal.     Breath sounds: Normal breath sounds.  Chest:  Breasts:        Right: No mass, nipple discharge, skin change or tenderness.        Left: No mass, nipple discharge, skin change or tenderness.  Abdominal:     Palpations: Abdomen is soft.     Tenderness: There is no abdominal tenderness. There is no guarding.  Musculoskeletal: Normal range of motion.  Neurological:     General: No focal deficit present.     Mental Status: She is alert and oriented to person, place, and time.     Cranial Nerves: No cranial nerve deficit.  Skin:    General: Skin is warm and dry.  Psychiatric:        Mood and Affect: Mood normal.        Behavior: Behavior normal.        Thought Content: Thought content normal.        Judgment: Judgment normal.  Vitals signs reviewed.     Assessment/Plan: Encounter for annual routine gynecological examination -   Vulvodynia - Plan: Doing well on cymbalta, followed at [facility].          GYN counsel adequate intake of calcium and vitamin D, diet and exercise     F/U  Return in about 1 year (around 08/25/2019).  Shaleen Talamantez B. Alfredo Collymore, PA-C 08/25/2018 8:42 AM

## 2018-08-25 ENCOUNTER — Other Ambulatory Visit: Payer: Self-pay

## 2018-08-25 ENCOUNTER — Encounter: Payer: Self-pay | Admitting: Obstetrics and Gynecology

## 2018-08-25 ENCOUNTER — Ambulatory Visit (INDEPENDENT_AMBULATORY_CARE_PROVIDER_SITE_OTHER): Payer: 59 | Admitting: Obstetrics and Gynecology

## 2018-08-25 VITALS — BP 120/90 | Ht 64.0 in | Wt 178.2 lb

## 2018-08-25 DIAGNOSIS — Z01419 Encounter for gynecological examination (general) (routine) without abnormal findings: Secondary | ICD-10-CM

## 2018-08-25 DIAGNOSIS — N94819 Vulvodynia, unspecified: Secondary | ICD-10-CM

## 2018-08-25 NOTE — Patient Instructions (Signed)
I value your feedback and entrusting us with your care. If you get a [facility] patient survey, I would appreciate you taking the time to let us know about your experience today. Thank you! 

## 2018-09-11 ENCOUNTER — Ambulatory Visit: Payer: 59 | Attending: Obstetrics & Gynecology | Admitting: Physical Therapy

## 2018-09-11 DIAGNOSIS — R293 Abnormal posture: Secondary | ICD-10-CM | POA: Insufficient documentation

## 2018-09-11 DIAGNOSIS — M791 Myalgia, unspecified site: Secondary | ICD-10-CM | POA: Insufficient documentation

## 2018-09-11 DIAGNOSIS — M62838 Other muscle spasm: Secondary | ICD-10-CM | POA: Insufficient documentation

## 2018-09-16 ENCOUNTER — Encounter: Payer: Self-pay | Admitting: Physical Therapy

## 2018-09-25 ENCOUNTER — Other Ambulatory Visit: Payer: Self-pay

## 2018-09-25 ENCOUNTER — Ambulatory Visit: Payer: 59 | Admitting: Physical Therapy

## 2018-09-25 DIAGNOSIS — M791 Myalgia, unspecified site: Secondary | ICD-10-CM

## 2018-09-25 DIAGNOSIS — R293 Abnormal posture: Secondary | ICD-10-CM | POA: Diagnosis not present

## 2018-09-25 DIAGNOSIS — M62838 Other muscle spasm: Secondary | ICD-10-CM | POA: Diagnosis present

## 2018-09-25 NOTE — Therapy (Addendum)
[place] MAIN [facility] SERVICES [address] [place], [place], [address] Phone: [phone]   Fax:  [phone]  Physical Therapy Treatment / Progress Note from 05/29/18 ( Visit 16)  to 09/25/18 (Visit 23)    Patient Details  Name: LYNNEA VANDERVOORT MRN: [id number] Date of Birth: Mar 29, 1985 Referring Provider (PT): Hal Neer   Encounter Date: 09/25/2018    Past Medical History:  Diagnosis Date  . Dysmenorrhea   . Hyperlipidemia   . Hypertension   . Tachycardia   . Vulvodynia     Past Surgical History:  Procedure Laterality Date  . EAR      TUBES    There were no vitals filed for this visit.  Subjective Assessment - 09/28/18 1530    Subjective  Pt reports she has been doing her bands. End of last week to beginning of this week, pt had relapse of pelvic discomfort  at the top, right mm at a 3/10  and it was not unbearable like before. Her back pain is still there but it is not as bad.          [place] Adult PT Treatment/Exercise - 09/28/18 1530      Therapeutic Activites    Other Therapeutic Activities  discussed POC with Maintanence       Manual Therapy   Manual therapy comments  STM/MWM at upper neck/ shoulders                 PT Short Term Goals - 09/25/18 0933      PT SHORT TERM GOAL #1   Title  Patient will demonstrate improved sitting and standing posture to demonstrate learning and decrease stress on the pelvic floor with functional activity.    Baseline  Anterior pelvic ,     Time  6    Period  Weeks    Status  Achieved    Target Date  05/22/18      PT SHORT TERM GOAL #2   Title  Patient will demonstrate improved pelvic alignment and balance of musculature surrounding the pelvis to facilitate decreased PFM spasms and decrease pelvic pain.    Baseline  L up-slip, R leg-long    Time  6    Period  Weeks    Status  Achieved    Target Date  11/29/18      PT SHORT TERM GOAL #3   Title  Patient will report  a reduction in pain to no greater than 1/10 over the prior week to demonstrate symptom improvement.    Baseline  3/10 over past week (debilitating during infection), As of 1-31: Max of 2/10    Time  6    Period  Weeks    Status  Achieved    Target Date  05/22/18        PT Long Term Goals - 08/21/18 1014      PT LONG TERM GOAL #1   Title  Patient will report no pain with intercourse or other vaginal penetration to demonstrate improved functional ability.    Baseline  Pain with initial penetration    Time  12    Period  Weeks    Status  Achieved      PT LONG TERM GOAL #2   Title  Patient will score less than or equal to 15% on the Female NIH-CPSI to demonstrate a reduction in pain, urinary symptoms, and an improved quality of life.    Baseline  Female NIH-CPSI:  14/43 (33%),  VQ: 11/33 (33%) As of 2/14: VQ: 7/33 (15%), Female NIH-CPSI: 5/43 (%),  As of 3/6: 0%)     Time  12    Period  Weeks    Status  Achieved      PT LONG TERM GOAL #3   Title  Patient will be independent with HEP and stress-management routines in order to return to PLOF.    Time  12    Period  Weeks    Status  Achieved      PT LONG TERM GOAL #4   Title  Patient will describe pelvic pain no greater than 0/10 during normal daily activities to demonstrate improved functional ability.    Baseline  3/10 max, as of 2/14: 2/10   , as of 3/6:  0/10 ( switching medication to Cymbalta has helped)     Time  12    Period  Weeks    Status  Achieved      PT LONG TERM GOAL #5   Title  Pt will demo decreased mm tightness at R T/L junction with less posterior rotation of trunk in order to minimzie worsening of rotaional component to scoliosis     Time  4    Period  Weeks    Status  Achieved      Additional Long Term Goals   Additional Long Term Goals  Yes      PT LONG TERM GOAL #6   Title  Pt will be IND with use of dumbbells and resistance bands with minimize risk for injuries to return to fitness and to compliment  repeated lifting at work.     Time  10    Period  Weeks    Status  Partially Met      PT LONG TERM GOAL #7   Title  Pt will report management of mm tensions in back and pelvic floor with work and lifting at work across 3 months in order to have a self35mnagement of pain    Time  20    Period  Weeks    Status  New    Target Date  01/08/19            Plan - 09/28/18 1528    Clinical Impression Statement  Pt has achieved 100% of her STG, and 6/7 LTG. Pt's remaining goal is focused on ensuring self-management of pain with decreased visits once of month. Pt has shown improved posture with significantly decreased mm tensions, less forward posture, more equal alignment of pelvic girdle, increased hip / SIJ mobility, and improved propioception of lower kinetic chain to minimize overactivity of pelvic floor mm. Pt has entered the fitness phase of her POC and showed IND with use of resistance bands for strengthening global mm. Pt would benefit from further skilled PT to maintain mm balance/ flexibility, decreased reoccurrence of pain related to scoliosis while having to continue with repeated lifting at work and progressing with strengthening with a fitness routine.    Rehab Potential  Good    PT Frequency  Monthy    PT Duration  12 weeks    PT Treatment/Interventions  ADLs/Self Care Home Management;Biofeedback;Electrical Stimulation;Moist Heat;Functional mobility training;Therapeutic activities;Therapeutic exercise;Balance training;Patient/family education;Neuromuscular re-education;Manual techniques;Passive range of motion;Taping;Dry needling;Spinal Manipulations;Joint Manipulations    Consulted and Agree with Plan of Care  Patient       Patient will benefit from skilled therapeutic intervention in order to improve the following deficits and impairments:  Increased fascial restricitons, Improper  body mechanics, Pain, Hypermobility, Increased muscle spasms, Postural dysfunction, Decreased  activity tolerance, Decreased range of motion, Decreased balance  Visit Diagnosis: Abnormal posture  Other muscle spasm  Myalgia     Problem List Patient Active Problem List   Diagnosis Date Noted  . Tachycardia   . Dysmenorrhea   . Vulvodynia   . Hypertension 08/26/2016    Jerl Mina ,PT, DPT, E-RYT  09/28/2018, 3:35 PM  [place] MAIN [facility] SERVICES [address] [place], [place], [address] Phone: [phone]   Fax:  [phone]  Name: SEAN MACWILLIAMS MRN: [id number] Date of Birth: Apr 08, 1985

## 2018-09-25 NOTE — Addendum Note (Signed)
Addended by: Jerl Mina on: 09/25/2018 09:33 AM   Modules accepted: Orders

## 2018-09-25 NOTE — Patient Instructions (Signed)
Standing with equal weight on both feet,  not in ballerina stance in order to minimize tightness of pelvic floor muscles   Semi tandem stance to distribute weight evenly across both legs, notice where your center of mass is to ballmounds of feet

## 2018-09-29 NOTE — Addendum Note (Signed)
Addended by: Jerl Mina on: 09/29/2018 11:10 PM   Modules accepted: Orders

## 2018-10-02 ENCOUNTER — Ambulatory Visit: Payer: 59

## 2018-10-02 ENCOUNTER — Other Ambulatory Visit: Payer: Self-pay

## 2018-10-02 DIAGNOSIS — M791 Myalgia, unspecified site: Secondary | ICD-10-CM

## 2018-10-02 DIAGNOSIS — R293 Abnormal posture: Secondary | ICD-10-CM | POA: Diagnosis not present

## 2018-10-02 DIAGNOSIS — M62838 Other muscle spasm: Secondary | ICD-10-CM

## 2018-10-02 NOTE — Patient Instructions (Signed)
  Shoulder Retraction and Downward Rotation   Rotate the shoulder blades back and down as if you had to hold a pencil between them, holding for 1 full second each time. Repeat this _10x3_ times _1-3_ times per day.

## 2018-10-02 NOTE — Therapy (Signed)
[place] MAIN [facility] SERVICES [address] [place], [place], [address] Phone: [phone]   Fax:  [phone]  Physical Therapy Treatment  The patient has been informed of current processes in place at Outpatient Rehab to protect patients from Covid-19 exposure including social distancing, schedule modifications, and new cleaning procedures. After discussing their particular risk with a therapist based on the patient's personal risk factors, the patient has decided to proceed with in-person therapy.   Patient Details  Name: KAYSE PUCCINI MRN: [id number] Date of Birth: 1985/11/28 Referring Provider (PT): Hal Neer   Encounter Date: 10/02/2018  PT End of Session - 10/02/18 1029    Visit Number  24    Date for PT Re-Evaluation  11/06/18    PT Start Time  0804    PT Stop Time  0904    PT Time Calculation (min)  60 min    Activity Tolerance  Patient tolerated treatment well    Behavior During Therapy  [facility] for tasks assessed/performed       Past Medical History:  Diagnosis Date  . Dysmenorrhea   . Hyperlipidemia   . Hypertension   . Tachycardia   . Vulvodynia     Past Surgical History:  Procedure Laterality Date  . EAR      TUBES    There were no vitals filed for this visit.  Pelvic Floor Physical Therapy Treatment Note  SCREENING  Changes in medications, allergies, or medical history?: none     SUBJECTIVE  Patient reports: Had a nerve block yesterday, had a lot of pain, had to take off of work.   Pain update:  Location of pain: R hip/back tight Current pain:  3/10  Max pain:  7/10 (following nerve block) Least pain:  0/10 Nature of pain: tightness  Patient Goals: To decrease tightening in the PFM and loosen up.   OBJECTIVE  Changes in: Posture/Observations:  Hyperkyphosis/lordosis.   Abdominal:  Pt. Demonstrates decreased scapular ROM into downward rotation B.  Palpation: TTP to paraspinals and  multifidus across T-L junction and B Pec major and minor.   INTERVENTIONS THIS SESSION: Therex: reviewed rib-flare correction exercise to improve recruitment and strength of TA, obliques, and multifidus in functional position. Attempted seated rows but Pt. Lacked sufficient ROM into scapular depression/downdard rotation and retraction. Manual: Performed TP release and STM to paraspinals and multifidus across T-L junction and B Pec major and minor to decrease spasm and pain and allow for improved balance of musculature for improved function and decreased symptoms. Dry-needle: Performed TPDN with a .30x65m needle and stadard approach as described below to decrease spasm and pain and allow for improved balance of musculature for improved function and decreased symptoms. .   Total time: 60 min                            Trigger Point Dry Needling - 10/02/18 0001    Consent Given?  Yes    Education Handout Provided  No    Muscles Treated Back/Hip  Erector spinae;Lumbar multifidi    Erector spinae Response  Twitch response elicited;Palpable increased muscle length    Lumbar multifidi Response  Twitch response elicited;Palpable increased muscle length             PT Short Term Goals - 09/25/18 0933      PT SHORT TERM GOAL #1   Title  Patient will demonstrate improved sitting and standing posture  to demonstrate learning and decrease stress on the pelvic floor with functional activity.    Baseline  Anterior pelvic ,     Time  6    Period  Weeks    Status  Achieved    Target Date  05/22/18      PT SHORT TERM GOAL #2   Title  Patient will demonstrate improved pelvic alignment and balance of musculature surrounding the pelvis to facilitate decreased PFM spasms and decrease pelvic pain.    Baseline  L up-slip, R leg-long    Time  6    Period  Weeks    Status  Achieved    Target Date  11/29/18      PT SHORT TERM GOAL #3   Title  Patient will report a  reduction in pain to no greater than 1/10 over the prior week to demonstrate symptom improvement.    Baseline  3/10 over past week (debilitating during infection), As of 1-31: Max of 2/10    Time  6    Period  Weeks    Status  Achieved    Target Date  05/22/18        PT Long Term Goals - 08/21/18 1014      PT LONG TERM GOAL #1   Title  Patient will report no pain with intercourse or other vaginal penetration to demonstrate improved functional ability.    Baseline  Pain with initial penetration    Time  12    Period  Weeks    Status  Achieved      PT LONG TERM GOAL #2   Title  Patient will score less than or equal to 15% on the Female NIH-CPSI to demonstrate a reduction in pain, urinary symptoms, and an improved quality of life.    Baseline  Female NIH-CPSI: 14/43 (33%),  VQ: 11/33 (33%) As of 2/14: VQ: 7/33 (15%), Female NIH-CPSI: 5/43 (%),  As of 3/6: 0%)     Time  12    Period  Weeks    Status  Achieved      PT LONG TERM GOAL #3   Title  Patient will be independent with HEP and stress-management routines in order to return to PLOF.    Time  12    Period  Weeks    Status  Achieved      PT LONG TERM GOAL #4   Title  Patient will describe pelvic pain no greater than 0/10 during normal daily activities to demonstrate improved functional ability.    Baseline  3/10 max, as of 2/14: 2/10   , as of 3/6:  0/10 ( switching medication to Cymbalta has helped)     Time  12    Period  Weeks    Status  Achieved      PT LONG TERM GOAL #5   Title  Pt will demo decreased mm tightness at R T/L junction with less posterior rotation of trunk in order to minimzie worsening of rotaional component to scoliosis     Time  4    Period  Weeks    Status  Achieved      Additional Long Term Goals   Additional Long Term Goals  Yes      PT LONG TERM GOAL #6   Title  Pt will be IND with use of dumbbells and resistance bands with minimize risk for injuries to return to fitness and to compliment  repeated lifting at work.  Time  10    Period  Weeks    Status  Partially Met      PT LONG TERM GOAL #7   Title  Pt will report management of mm tensions in back and pelvic floor with work and lifting at work across 3 months in order to have a self58mnagement of pain    Time  20    Period  Weeks    Status  New    Target Date  01/08/19            Plan - 10/02/18 1030    Clinical Impression Statement  Pt. Responded well to all interventions today, demonstrating decreased spasm and tenderness as well as improved deep-core recruitment and understanding and correct performance of all education and exercises provided today. They will continue to benefit from skilled physical therapy to work toward remaining goals and maximize function as well as decrease likelihood of symptom increase or recurrence.     Consulted and Agree with Plan of Care  Patient       Patient will benefit from skilled therapeutic intervention in order to improve the following deficits and impairments:     Visit Diagnosis: Abnormal posture  Other muscle spasm  Myalgia     Problem List Patient Active Problem List   Diagnosis Date Noted  . Tachycardia   . Dysmenorrhea   . Vulvodynia   . Hypertension 08/26/2016   KWilla RoughDPT, ATC KWilla Rough8/28/2020, 10:32 AM  [place] [facility] [address] [address]Phone: [phone]  Fax:  [phone] Name: KDANITY SCHMELZERMRN: [id number] of Birth: 412-12-1985

## 2018-10-30 ENCOUNTER — Ambulatory Visit: Payer: 59 | Attending: Obstetrics & Gynecology

## 2018-10-30 ENCOUNTER — Other Ambulatory Visit: Payer: Self-pay

## 2018-10-30 DIAGNOSIS — R293 Abnormal posture: Secondary | ICD-10-CM

## 2018-10-30 DIAGNOSIS — M62838 Other muscle spasm: Secondary | ICD-10-CM | POA: Diagnosis present

## 2018-10-30 NOTE — Therapy (Addendum)
[place] MAIN [facility] SERVICES [address] [place], [place], [address] Phone: [phone]   Fax:  [phone]  Physical Therapy Treatment The patient has been informed of current processes in place at Outpatient Rehab to protect patients from Covid-19 exposure including social distancing, schedule modifications, and new cleaning procedures. After discussing their particular risk with a therapist based on the patient's personal risk factors, the patient has decided to proceed with in-person therapy.   Patient Details  Name: Carrie Kennedy MRN: [id number] Date of Birth: 11-29-1985 No data recorded  Encounter Date: 10/30/2018  PT End of Session - 11/02/18 0802    Visit Number  25    Number of Visits  32    Date for PT Re-Evaluation  11/06/18    Authorization - Visit Number  5    Authorization - Number of Visits  12    PT Start Time  0800    PT Stop Time  0900    PT Time Calculation (min)  60 min    Activity Tolerance  Patient tolerated treatment well    Behavior During Therapy  [facility] for tasks assessed/performed       Past Medical History:  Diagnosis Date  . Dysmenorrhea   . Hyperlipidemia   . Hypertension   . Tachycardia   . Vulvodynia     Past Surgical History:  Procedure Laterality Date  . EAR      TUBES    There were no vitals filed for this visit.   Pelvic Floor Physical Therapy Treatment Note  SCREENING  Changes in medications, allergies, or medical history?: was diagnosed with bacterial vaginitis and is on second medication to manage.     SUBJECTIVE  Patient reports: Has been doing all the things to try to prevent getting bacterial vaginitis. Has been on antibiotics for a lot of her life. 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  Pain update:  Location of pain: low back/SIJ Current pain:  2/10  Max pain:  4/10  Least pain:  0/10 Nature of pain:  tightness  Patient Goals: To decrease tightening in the PFM and loosen up.   OBJECTIVE  Changes in: Posture/Observations:  Hyperkyphosis/lordosis.   Abdominal:  Is demonstrating over-engagement of Obliques and decreased TA recruitment but able to improve with TC, VC fir rib-flare correction exercise.  Palpation: TTP to Erector spinae and Multifidus in the L4-L5 region B.   INTERVENTIONS THIS SESSION: Therex: reviewed rib-flare correction exercise to improve recruitment and strength of TA, obliques, and multifidus in functional position.  Self-care: Discussed gut/vaginal flora as a potential cause of recurrent bacterial vaginitis and the potential use of a daily probiotic to help reduce/manage to decrease vaginal irritation. Manual: Performed TP release and STM to paraspinals and multifidus at B L4-L5 levels to decrease spasm and pain and allow for improved balance of musculature for improved function and decreased symptoms. Dry-needle: Performed TPDN with a .30x71m needle and stadard approach as described below to decrease spasm and pain and allow for improved balance of musculature for improved function and decreased symptoms. .   Total time: 60 min                   Trigger Point Dry Needling - 11/02/18 0001    Consent Given?  Yes    Education Handout Provided  No    Muscles Treated Back/Hip  Erector spinae;Lumbar multifidi    Erector  spinae Response  Twitch response elicited;Palpable increased muscle length    Lumbar multifidi Response  Twitch response elicited;Palpable increased muscle length             PT Short Term Goals - 09/25/18 0933      PT SHORT TERM GOAL #1   Title  Patient will demonstrate improved sitting and standing posture to demonstrate learning and decrease stress on the pelvic floor with functional activity.    Baseline  Anterior pelvic ,     Time  6    Period  Weeks    Status  Achieved    Target Date  05/22/18      PT SHORT  TERM GOAL #2   Title  Patient will demonstrate improved pelvic alignment and balance of musculature surrounding the pelvis to facilitate decreased PFM spasms and decrease pelvic pain.    Baseline  L up-slip, R leg-long    Time  6    Period  Weeks    Status  Achieved    Target Date  11/29/18      PT SHORT TERM GOAL #3   Title  Patient will report a reduction in pain to no greater than 1/10 over the prior week to demonstrate symptom improvement.    Baseline  3/10 over past week (debilitating during infection), As of 1-31: Max of 2/10    Time  6    Period  Weeks    Status  Achieved    Target Date  05/22/18        PT Long Term Goals - 08/21/18 1014      PT LONG TERM GOAL #1   Title  Patient will report no pain with intercourse or other vaginal penetration to demonstrate improved functional ability.    Baseline  Pain with initial penetration    Time  12    Period  Weeks    Status  Achieved      PT LONG TERM GOAL #2   Title  Patient will score less than or equal to 15% on the Female NIH-CPSI to demonstrate a reduction in pain, urinary symptoms, and an improved quality of life.    Baseline  Female NIH-CPSI: 14/43 (33%),  VQ: 11/33 (33%) As of 2/14: VQ: 7/33 (15%), Female NIH-CPSI: 5/43 (%),  As of 3/6: 0%)     Time  12    Period  Weeks    Status  Achieved      PT LONG TERM GOAL #3   Title  Patient will be independent with HEP and stress-management routines in order to return to PLOF.    Time  12    Period  Weeks    Status  Achieved      PT LONG TERM GOAL #4   Title  Patient will describe pelvic pain no greater than 0/10 during normal daily activities to demonstrate improved functional ability.    Baseline  3/10 max, as of 2/14: 2/10   , as of 3/6:  0/10 ( switching medication to Cymbalta has helped)     Time  12    Period  Weeks    Status  Achieved      PT LONG TERM GOAL #5   Title  Pt will demo decreased mm tightness at R T/L junction with less posterior rotation of trunk  in order to minimzie worsening of rotaional component to scoliosis     Time  4    Period  Weeks    Status  Achieved      Additional Long Term Goals   Additional Long Term Goals  Yes      PT LONG TERM GOAL #6   Title  Pt will be IND with use of dumbbells and resistance bands with minimize risk for injuries to return to fitness and to compliment repeated lifting at work.     Time  10    Period  Weeks    Status  Partially Met      PT LONG TERM GOAL #7   Title  Pt will report management of mm tensions in back and pelvic floor with work and lifting at work across 3 months in order to have a self27mnagement of pain    Time  20    Period  Weeks    Status  New    Target Date  01/08/19            Plan - 11/02/18 0803    Clinical Impression Statement  Pt. Responded well to all interventions today, demonstrating decreased spasm and tenderness, improved exercise performance, as well as understanding  of all education provided today. They will continue to benefit from skilled physical therapy to work toward remaining goals and maximize function as well as decrease likelihood of symptom increase or recurrence.     PT Next Visit Plan  Internal TP release and coccyx mobs out of flexion and/or work to decrease hip-flexor spasm/tension, review rib-flare correction and progress posterior pelvic tilts to mini-marches.    Consulted and Agree with Plan of Care  Patient       Patient will benefit from skilled therapeutic intervention in order to improve the following deficits and impairments:     Visit Diagnosis: Abnormal posture  Other muscle spasm     Problem List Patient Active Problem List   Diagnosis Date Noted  . Tachycardia   . Dysmenorrhea   . Vulvodynia   . Hypertension 08/26/2016   KWilla RoughDPT, ATC KWilla Rough9/28/2020, 8:04 AM  [place]MAIN [facility]Cl[facility] CenterSERV[facility] [address] [address]Phone: [phone]   Fax:  [phone] Name: Carrie Kennedy: [id number] of Birth: [birth year]

## 2018-11-06 ENCOUNTER — Ambulatory Visit: Payer: 59 | Attending: Obstetrics & Gynecology

## 2018-11-06 ENCOUNTER — Other Ambulatory Visit: Payer: Self-pay

## 2018-11-06 DIAGNOSIS — R293 Abnormal posture: Secondary | ICD-10-CM | POA: Diagnosis not present

## 2018-11-06 DIAGNOSIS — M62838 Other muscle spasm: Secondary | ICD-10-CM | POA: Insufficient documentation

## 2018-11-06 DIAGNOSIS — M791 Myalgia, unspecified site: Secondary | ICD-10-CM

## 2018-11-06 NOTE — Therapy (Signed)
[place] [facility] MAIN [facility] SERVICES [address] [place], [place], [address] Phone: [phone]   Fax:  [phone]  Physical Therapy Treatment  Patient Details  Name: Carrie Kennedy MRN: [id number] Date of Birth: Sep 09, 1985 No data recorded  Encounter Date: 11/06/2018  PT End of Session - 11/06/18 0920    Visit Number  26    Number of Visits  32    Date for PT Re-Evaluation  11/06/18    Authorization - Visit Number  6    Authorization - Number of Visits  12    PT Start Time  0805    PT Stop Time  0905    PT Time Calculation (min)  60 min    Activity Tolerance  Patient tolerated treatment well    Behavior During Therapy  [facility] for tasks assessed/performed       Past Medical History:  Diagnosis Date  . Dysmenorrhea   . Hyperlipidemia   . Hypertension   . Tachycardia   . Vulvodynia     Past Surgical History:  Procedure Laterality Date  . EAR      TUBES    There were no vitals filed for this visit.    SUBJECTIVE  Patient reports: That she is feeling a lot better. 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 Patient reports she has been compliant with HEP- however had questions on performing hip ABD/extension exercises.   Pain update:  Location of pain: low back/SIJ Current pain:  1/10  Max pain: 2/10  Least pain: 0/10 Nature of pain:"a little ache-ness"  Patient Goals: To decrease tightening in the PFM and loosen up.   OBJECTIVE  Changes in: Posture/Observations:  Hyperkyphosis/lordosis. Difficulty recruiting glutes for appropriate squat form, low back musculature over-engages.  Palpation: TTP to Erector spinae and Multifidus in the T10/11/12 region B.   INTERVENTIONS THIS SESSION: Therex: Hip abductions Bx10- with cues for decrease hip flexion engagement and greater glute engagement, additional cues for breathing coordination. Glute Med leg extensions B x15 with  cues for deep core and breathing coordination. Scapular retraction and depression B x15 following manual therapy and DNTP.   TherAct: Pt demonstrates preference for anterior weight shifting w/ hip ER and back extensors when squatting- educated on foot position and glute engagement with increased posterior weight shift for work safety.  Pt education on continuing HEP and squat form/breathing coordination in relationship to the structure and function of the pelvic floor in relation to their symptoms. Pt demonstrated and verbalized understanding.    Manual: Performed TP release and STM to paraspinals and multifidus at B T11/12 levels to decrease spasm and pain and allow for improved balance of musculature for improved function and decreased symptoms.Performed grade II/III to T10/11/12 to decrease spasm and pain and allow for improved balance of musculature for improved function and decreased symptoms.   Dry-needle: Performed TPDN with a .30x36mm needle and standard approach as described above to decrease spasm and pain and allow for improved balance of musculature for improved function and decreased symptoms.   Total time: 60 min                   Trigger Point Dry Needling - 11/06/18 0001    Consent Given?  Yes    Education Handout Provided  No    Muscles Treated Back/Hip  Erector spinae;Lumbar multifidi    Erector spinae Response  Twitch response elicited;Palpable increased muscle length  Lumbar multifidi Response  Twitch response elicited;Palpable increased muscle length             PT Short Term Goals - 09/25/18 0933      PT SHORT TERM GOAL #1   Title  Patient will demonstrate improved sitting and standing posture to demonstrate learning and decrease stress on the pelvic floor with functional activity.    Baseline  Anterior pelvic ,     Time  6    Period  Weeks    Status  Achieved    Target Date  05/22/18      PT SHORT TERM GOAL #2   Title  Patient will  demonstrate improved pelvic alignment and balance of musculature surrounding the pelvis to facilitate decreased PFM spasms and decrease pelvic pain.    Baseline  L up-slip, R leg-long    Time  6    Period  Weeks    Status  Achieved    Target Date  11/29/18      PT SHORT TERM GOAL #3   Title  Patient will report a reduction in pain to no greater than 1/10 over the prior week to demonstrate symptom improvement.    Baseline  3/10 over past week (debilitating during infection), As of 1-31: Max of 2/10    Time  6    Period  Weeks    Status  Achieved    Target Date  05/22/18        PT Long Term Goals - 11/06/18 0927      PT LONG TERM GOAL #1   Title  Patient will report no pain with intercourse or other vaginal penetration to demonstrate improved functional ability.    Baseline  Pain with initial penetration    Time  12    Period  Weeks    Status  Achieved      PT LONG TERM GOAL #2   Title  Patient will score less than or equal to 15% on the Female NIH-CPSI to demonstrate a reduction in pain, urinary symptoms, and an improved quality of life.    Baseline  Female NIH-CPSI: 14/43 (33%),  VQ: 11/33 (33%) As of 2/14: VQ: 7/33 (15%), Female NIH-CPSI: 5/43 (%),  As of 3/6: 0%)     Time  12    Period  Weeks    Status  Achieved      PT LONG TERM GOAL #3   Title  Patient will be independent with HEP and stress-management routines in order to return to PLOF.    Time  12    Period  Weeks    Status  Achieved      PT LONG TERM GOAL #4   Title  Patient will describe pelvic pain no greater than 0/10 during normal daily activities to demonstrate improved functional ability.    Baseline  3/10 max, as of 2/14: 2/10   , as of 3/6:  0/10 ( switching medication to Cymbalta has helped)     Time  12    Period  Weeks    Status  Achieved      PT LONG TERM GOAL #5   Title  Pt will demo decreased mm tightness at R T/L junction with less posterior rotation of trunk in order to minimzie worsening of  rotaional component to scoliosis     Time  4    Period  Weeks    Status  Achieved      PT LONG TERM GOAL #6  Title  Pt will be IND with HEP using body-weight, dumbell, and resistance bands and appropriate form to minimize risk of injury and returned spasms with daily bending/lifting required for job function.    Baseline  Over-use of lumbar extensors and obliques, decreased recruitment of Glutes and TA and poor form.    Time  10    Period  Weeks    Status  Revised    Target Date  01/15/19      PT LONG TERM GOAL #7   Title  Pt will report management of mm tensions in back and pelvic floor with work and lifting at work across 3 months in order to have a self610management of pain    Time  20    Period  Weeks    Status  On-going    Target Date  01/15/19            Plan - 11/06/18 [id number]    Clinical Impression Statement  Pt. responded well to all interventions today, demonstrating decreased spasms and pain as well as improved ability to recruit her TA and glutes but she continues to demonstrate poor form with squat mechanics and imbalance of musculature that will cause her to have return of back spasms and pain. we will continue at one time per week for 10 more weeks to focus on strengthening imbalances and preventing return of spasms and tapering to D/C as Pt. becomes IND with HEP.    Stability/Clinical Decision Making  Stable/Uncomplicated    Clinical Decision Making  Low    Rehab Potential  Good    PT Frequency  Other (comment)    PT Duration  Other (comment)    PT Treatment/Interventions  ADLs/Self Care Home Management;Biofeedback;Electrical Stimulation;Moist Heat;Functional mobility training;Therapeutic activities;Therapeutic exercise;Balance training;Patient/family education;Neuromuscular re-education;Manual techniques;Passive range of motion;Taping;Dry needling;Spinal Manipulations;Joint Manipulations    PT Next Visit Plan  Review squat form, practice tall-kneeling plank with  serratus punch, half-kneeling psoas lengthening    PT Home Exercise Plan  Bird-dog, piriformis stretch, child's pose, kegels, diaphragmatic breathing, hip-flexor stretch, side-stretch, bow-and-arrow, side-plank, knee plank, hip ext, wall squats, monster walks, seated and standing hip-flexor stretch, neural flossing for RLE, chest-press to decrease rib-flare.Marland Kitchen.    Consulted and Agree with Plan of Care  Patient       Patient will benefit from skilled therapeutic intervention in order to improve the following deficits and impairments:  Increased fascial restricitons, Improper body mechanics, Pain, Hypermobility, Increased muscle spasms, Postural dysfunction, Decreased activity tolerance, Decreased range of motion, Decreased balance  Visit Diagnosis: Abnormal posture  Other muscle spasm  Myalgia     Problem List Patient Active Problem List   Diagnosis Date Noted  . Tachycardia   . Dysmenorrhea   . Vulvodynia   . Hypertension 08/26/2016   Cleophus MoltKeeli T. Treshon Stannard DPT, ATC Cleophus MoltKeeli T Riyansh Gerstner 11/06/2018, 9:58 AM  [facility] MAIN [facility] [address] [place], [place], [address] Phone: [phone]   Fax:  [phone]  Name: Carrie Kennedy MRN: [id number] Date of Birth: 12-Mar-1985

## 2018-11-06 NOTE — Addendum Note (Signed)
Addended by: Letitia Libra T on: 11/06/2018 10:27 AM   Modules accepted: Orders

## 2018-11-13 ENCOUNTER — Other Ambulatory Visit: Payer: Self-pay

## 2018-11-13 ENCOUNTER — Ambulatory Visit: Payer: 59

## 2018-11-13 DIAGNOSIS — M62838 Other muscle spasm: Secondary | ICD-10-CM

## 2018-11-13 DIAGNOSIS — M791 Myalgia, unspecified site: Secondary | ICD-10-CM

## 2018-11-13 DIAGNOSIS — R293 Abnormal posture: Secondary | ICD-10-CM

## 2018-11-13 NOTE — Therapy (Signed)
[place] MAIN [facility] SERVICES [address] 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 After discussing their particular risk with a therapist based on the patient's personal risk factors, the patient has decided to proceed with in-person therapy.   Patient Details  Name: Carrie Kennedy MRN: [id number] Date of Birth: [birth year] No data recorded  Encounter Date: 11/13/2018  PT End of Session - 11/13/18 0832    Visit Number  27    Number of Visits  32    Date for PT Re-Evaluation  11/06/18    Authorization - Visit Number  7    Authorization - Number of Visits  12    PT Start Time  0810    PT Stop Time  0900    PT Time Calculation (min)  50 min    Activity Tolerance  Patient tolerated treatment well    Behavior During Therapy  [facility] for tasks assessed/performed       Past Medical History:  Diagnosis Date  . Dysmenorrhea   . Hyperlipidemia   . Hypertension   . Tachycardia   . Vulvodynia     Past Surgical History:  Procedure Laterality Date  . EAR      TUBES    There were no vitals filed for this visit.   Pelvic Floor Therapy Treatment  SUBJECTIVE  Patient reports: Pt reports that she has low back and low abdominal tenderness due to menstrual cycle. Patient reports that as she has gotten older, the pain takes longer to increase in the morning during her menstrual cycle.   Pain update:  Location of pain: low back/SIJ Current pain:  1/10  Max pain: 2/10  Least pain: 0/10 Nature of pain:"a little ache-ness" (period pain) Patient Goals: To decrease tightening in the PFM and loosen up.   OBJECTIVE  Changes in: Posture/Observations:   Hyperkyphosis/lordosis.  Difficulty recruiting low back and TA>obliques to draw pelvis to neutral, due to hip flexor TTP.   ROM: Pt struggles to attain neutral pelvis in standing due to hip-flexor tightness.   *following treatment she is able to attain improved pelvic tilt but her knees bend slightly due to remaining quad tightness.  Palpation: TTP to B pectineus, illiacus, psoas.   INTERVENTIONS THIS SESSION: Therex: Coordination of deep abdominals with small marches in hook-lying in order to facilitate posterior pelvic tilt in order to transfer to functional positions. (3 minutes)  Manual: Performed TP release and STM to B pectineus, illiacus, psoas to decrease spasm and pain and allow for improved balance of musculature for improved function and decreased symptoms. (43 minutes)  Dry-needle: Performed TPDN with a .30x8mm needle and standard approach as described above to decrease spasm and pain and allow for improved balance of musculature for improved function and decreased symptoms.    Total time: 50 min (8:10-9:00)                                 PT Short Term Goals - 09/25/18 0933      PT SHORT TERM GOAL #1   Title  Patient will demonstrate improved sitting and standing posture to demonstrate learning and decrease stress on the pelvic floor with functional activity.  Baseline  Anterior pelvic ,     Time  6    Period  Weeks    Status  Achieved    Target Date  05/22/18      PT SHORT TERM GOAL #2   Title  Patient will demonstrate improved pelvic alignment and balance of musculature surrounding the pelvis to facilitate decreased PFM spasms and decrease pelvic pain.    Baseline  L up-slip, R leg-long    Time  6    Period  Weeks    Status  Achieved    Target Date  11/29/18      PT SHORT TERM GOAL #3   Title  Patient will report a reduction in pain to no greater than 1/10 over the prior week to demonstrate symptom improvement.     Baseline  3/10 over past week (debilitating during infection), As of 1-31: Max of 2/10    Time  6    Period  Weeks    Status  Achieved    Target Date  05/22/18        PT Long Term Goals - 11/06/18 0927      PT LONG TERM GOAL #1   Title  Patient will report no pain with intercourse or other vaginal penetration to demonstrate improved functional ability.    Baseline  Pain with initial penetration    Time  12    Period  Weeks    Status  Achieved      PT LONG TERM GOAL #2   Title  Patient will score less than or equal to 15% on the Female NIH-CPSI to demonstrate a reduction in pain, urinary symptoms, and an improved quality of life.    Baseline  Female NIH-CPSI: 14/43 (33%),  VQ: 11/33 (33%) As of 2/14: VQ: 7/33 (15%), Female NIH-CPSI: 5/43 (%),  As of 3/6: 0%)     Time  12    Period  Weeks    Status  Achieved      PT LONG TERM GOAL #3   Title  Patient will be independent with HEP and stress-management routines in order to return to PLOF.    Time  12    Period  Weeks    Status  Achieved      PT LONG TERM GOAL #4   Title  Patient will describe pelvic pain no greater than 0/10 during normal daily activities to demonstrate improved functional ability.    Baseline  3/10 max, as of 2/14: 2/10   , as of 3/6:  0/10 ( switching medication to Cymbalta has helped)     Time  12    Period  Weeks    Status  Achieved      PT LONG TERM GOAL #5   Title  Pt will demo decreased mm tightness at R T/L junction with less posterior rotation of trunk in order to minimzie worsening of rotaional component to scoliosis     Time  4    Period  Weeks    Status  Achieved      PT LONG TERM GOAL #6   Title  Pt will be IND with HEP using body-weight, dumbell, and resistance bands and appropriate form to minimize risk of injury and returned spasms with daily bending/lifting required for job function.    Baseline  Over-use of lumbar extensors and obliques, decreased recruitment of Glutes and TA and poor form.     Time  10    Period  Weeks  Status  Revised    Target Date  01/15/19      PT LONG TERM GOAL #7   Title  Pt will report management of mm tensions in back and pelvic floor with work and lifting at work across 3 months in order to have a self440management of pain    Time  20    Period  Weeks    Status  On-going    Target Date  01/15/19            Plan - 11/13/18 [id number]    Clinical Impression Statement  Pt. Responded well to all interventions today, demonstrating decreased B hip flexor spasm and tenderness, with improved pelvic neutral following manual treatment, pt as well has understanding and correct performance of all education and exercises provided today. They will continue to benefit from skilled physical therapy to work toward remaining goals and maximize function as well as decrease likelihood of symptom increase or recurrence.    Stability/Clinical Decision Making  Stable/Uncomplicated    Rehab Potential  Good    PT Frequency  Other (comment)    PT Duration  Other (comment)    PT Treatment/Interventions  ADLs/Self Care Home Management;Biofeedback;Electrical Stimulation;Moist Heat;Functional mobility training;Therapeutic activities;Therapeutic exercise;Balance training;Patient/family education;Neuromuscular re-education;Manual techniques;Passive range of motion;Taping;Dry needling;Spinal Manipulations;Joint Manipulations    PT Next Visit Plan  Review squat form, practice tall-kneeling plank with serratus punch, half-kneeling psoas lengthening; pelvic neutral in functional positions.    PT Home Exercise Plan  Bird-dog, piriformis stretch, child's pose, kegels, diaphragmatic breathing, hip-flexor stretch, side-stretch, bow-and-arrow, side-plank, knee plank, hip ext, wall squats, monster walks, seated and standing hip-flexor stretch, neural flossing for RLE, chest-press to decrease rib-flare.Marland Kitchen.    Consulted and Agree with Plan of Care  Patient       Patient will benefit from  skilled therapeutic intervention in order to improve the following deficits and impairments:  Increased fascial restricitons, Improper body mechanics, Pain, Hypermobility, Increased muscle spasms, Postural dysfunction, Decreased activity tolerance, Decreased range of motion, Decreased balance  Visit Diagnosis: Abnormal posture  Other muscle spasm  Myalgia     Problem List Patient Active Problem List   Diagnosis Date Noted  . Tachycardia   . Dysmenorrhea   . Vulvodynia   . Hypertension 08/26/2016   Cleophus MoltKeeli T. Chavy Avera DPT, ATC Cleophus MoltKeeli T Worley Radermacher 11/13/2018, 8:34 AM  [place] [facility] [address] [place], [place], [address] Phone: [phone]   Fax:  [phone]  Name: Carrie Kennedy MRN: [id number] Date of Birth: 01/28/1986

## 2018-11-27 ENCOUNTER — Ambulatory Visit: Payer: 59

## 2018-11-27 ENCOUNTER — Other Ambulatory Visit: Payer: Self-pay

## 2018-11-27 DIAGNOSIS — M62838 Other muscle spasm: Secondary | ICD-10-CM

## 2018-11-27 DIAGNOSIS — R293 Abnormal posture: Secondary | ICD-10-CM | POA: Diagnosis not present

## 2018-11-27 DIAGNOSIS — M791 Myalgia, unspecified site: Secondary | ICD-10-CM

## 2018-11-27 NOTE — Therapy (Signed)
[place] MAIN [facility] SERVICES [address] [place], [place], [address] Phone: [phone]   Fax:  [phone]  Physical Therapy Treatment  The patient has been informed of current processes in place at Outpatient Rehab to protect patients from Covid-19 exposure including social distancing, schedule modifications, and new cleaning procedures. After discussing their particular risk with a therapist based on the patient's personal risk factors, the patient has decided to proceed with in-person therapy.   Patient Details  Name: Carrie Kennedy MRN: [id number] Date of Birth: Oct 08, 1985 No data recorded  Encounter Date: 11/27/2018  PT End of Session - 11/27/18 0912    Visit Number  28    Number of Visits  42    Date for PT Re-Evaluation  01/15/19    Authorization - Visit Number  2    Authorization - Number of Visits  10    PT Start Time  0802    PT Stop Time  0900    PT Time Calculation (min)  58 min    Activity Tolerance  Patient tolerated treatment well    Behavior During Therapy  [facility] for tasks assessed/performed       Past Medical History:  Diagnosis Date  . Dysmenorrhea   . Hyperlipidemia   . Hypertension   . Tachycardia   . Vulvodynia     Past Surgical History:  Procedure Laterality Date  . EAR      TUBES    There were no vitals filed for this visit.   Pelvic Floor Therapy Treatment  SUBJECTIVE  Patient reports: Low back is doing pretty well, has become more aware of her posture in sitting and  Standing. She is also doing better with bending/lifting animals. Having some mid-back pain.  Pain update:  Location of pain: mid-back Current pain:  1/10  Max pain: 2/10  Least pain: 0/10 Nature of pain:"a little ache-ness" Patient Goals: To decrease tightening in the PFM and loosen up.   OBJECTIVE  Changes in: Posture/Observations:  Hyperkyphosis more apparent than hyperkyphosis now.   ROM: Pt. Able to to  achieve neutral pelvic tilt without bending knees today! Decreased hyperkyphosis in standing following treatment.  Palpation: TTP to L>R thoracic paraspinals through T5-T10.  INTERVENTIONS THIS SESSION: Therex: Reviewed and practiced prone scapular retraction and chin-tuck (superman) to improve thoracic alignment and posture.  Manual: Performed TP release and STM to thoracic paraspinals through T5-T10  to decrease spasm and pain and allow for improved balance of musculature for improved function and decreased symptoms. Performed grade 3-4 mobs to T3-8 vertebrae to improve mobility of joint and surrounding connective tissue and decrease pressure on nerve roots for improved conductivity and function of down-stream tissues.   Dry-needle: Performed TPDN with a .30x63mm needle and standard approach as described below to decrease spasm and pain and allow for improved balance of musculature for improved function and decreased symptoms.    Total time: 58                   Trigger Point Dry Needling - 11/27/18 0001    Consent Given?  Yes    Education Handout Provided  No    Muscles Treated Back/Hip  Erector spinae    Dry Needling Comments  ~ T6-7    Erector spinae Response  Twitch response elicited;Palpable increased muscle length             PT Short Term Goals - 09/25/18 0933      PT  SHORT TERM GOAL #1   Title  Patient will demonstrate improved sitting and standing posture to demonstrate learning and decrease stress on the pelvic floor with functional activity.    Baseline  Anterior pelvic ,     Time  6    Period  Weeks    Status  Achieved    Target Date  05/22/18      PT SHORT TERM GOAL #2   Title  Patient will demonstrate improved pelvic alignment and balance of musculature surrounding the pelvis to facilitate decreased PFM spasms and decrease pelvic pain.    Baseline  L up-slip, R leg-long    Time  6    Period  Weeks    Status  Achieved    Target Date   11/29/18      PT SHORT TERM GOAL #3   Title  Patient will report a reduction in pain to no greater than 1/10 over the prior week to demonstrate symptom improvement.    Baseline  3/10 over past week (debilitating during infection), As of 1-31: Max of 2/10    Time  6    Period  Weeks    Status  Achieved    Target Date  05/22/18        PT Long Term Goals - 11/06/18 0927      PT LONG TERM GOAL #1   Title  Patient will report no pain with intercourse or other vaginal penetration to demonstrate improved functional ability.    Baseline  Pain with initial penetration    Time  12    Period  Weeks    Status  Achieved      PT LONG TERM GOAL #2   Title  Patient will score less than or equal to 15% on the Female NIH-CPSI to demonstrate a reduction in pain, urinary symptoms, and an improved quality of life.    Baseline  Female NIH-CPSI: 14/43 (33%),  VQ: 11/33 (33%) As of 2/14: VQ: 7/33 (15%), Female NIH-CPSI: 5/43 (%),  As of 3/6: 0%)     Time  12    Period  Weeks    Status  Achieved      PT LONG TERM GOAL #3   Title  Patient will be independent with HEP and stress-management routines in order to return to PLOF.    Time  12    Period  Weeks    Status  Achieved      PT LONG TERM GOAL #4   Title  Patient will describe pelvic pain no greater than 0/10 during normal daily activities to demonstrate improved functional ability.    Baseline  3/10 max, as of 2/14: 2/10   , as of 3/6:  0/10 ( switching medication to Cymbalta has helped)     Time  12    Period  Weeks    Status  Achieved      PT LONG TERM GOAL #5   Title  Pt will demo decreased mm tightness at R T/L junction with less posterior rotation of trunk in order to minimzie worsening of rotaional component to scoliosis     Time  4    Period  Weeks    Status  Achieved      PT LONG TERM GOAL #6   Title  Pt will be IND with HEP using body-weight, dumbell, and resistance bands and appropriate form to minimize risk of injury and returned  spasms with daily bending/lifting required for job function.  Baseline  Over-use of lumbar extensors and obliques, decreased recruitment of Glutes and TA and poor form.    Time  10    Period  Weeks    Status  Revised    Target Date  01/15/19      PT LONG TERM GOAL #7   Title  Pt will report management of mm tensions in back and pelvic floor with work and lifting at work across 3 months in order to have a self33management of pain    Time  20    Period  Weeks    Status  On-going    Target Date  01/15/19            Plan - 11/27/18 0913    Clinical Impression Statement  Pt. Responded well to all interventions today, demonstrating improved posture and postural awareness as well as understanding and correct performance of all education and exercises provided today. They will continue to benefit from skilled physical therapy to work toward remaining goals and maximize function as well as decrease likelihood of symptom increase or recurrence.     Stability/Clinical Decision Making  Stable/Uncomplicated    Rehab Potential  Good    PT Frequency  Other (comment)    PT Duration  Other (comment)    PT Treatment/Interventions  ADLs/Self Care Home Management;Biofeedback;Electrical Stimulation;Moist Heat;Functional mobility training;Therapeutic activities;Therapeutic exercise;Balance training;Patient/family education;Neuromuscular re-education;Manual techniques;Passive range of motion;Taping;Dry needling;Spinal Manipulations;Joint Manipulations    PT Next Visit Plan  Review squat form, practice tall-kneeling plank with serratus punch, half-kneeling psoas lengthening; pelvic neutral in functional positions.    PT Home Exercise Plan  Bird-dog, piriformis stretch, child's pose, kegels, diaphragmatic breathing, hip-flexor stretch, side-stretch, bow-and-arrow, side-plank, knee plank, hip ext, wall squats, monster walks, seated and standing hip-flexor stretch, neural flossing for RLE, chest-press to  decrease rib-flare.Marland Kitchen    Consulted and Agree with Plan of Care  Patient       Patient will benefit from skilled therapeutic intervention in order to improve the following deficits and impairments:  Increased fascial restricitons, Improper body mechanics, Pain, Hypermobility, Increased muscle spasms, Postural dysfunction, Decreased activity tolerance, Decreased range of motion, Decreased balance  Visit Diagnosis: Abnormal posture  Other muscle spasm  Myalgia     Problem List Patient Active Problem List   Diagnosis Date Noted  . Tachycardia   . Dysmenorrhea   . Vulvodynia   . Hypertension 08/26/2016   Cleophus Molt DPT, ATC Cleophus Molt 11/27/2018, 9:18 AM  [place] [facility] MAIN [facility] SERVICES [address] [place], [place], [address] Phone: [phone]   Fax:  [phone]  Name: SEHAM GARDENHIRE MRN: [id number] Date of Birth: 03/31/1985

## 2018-11-27 NOTE — Patient Instructions (Signed)
   Exhale as you come up, pulling shoulder blades back and down and do a chin-tuck as you exhale. Do 2x15, 1 time per day.

## 2018-12-04 ENCOUNTER — Other Ambulatory Visit: Payer: Self-pay

## 2018-12-04 ENCOUNTER — Ambulatory Visit: Payer: 59

## 2018-12-04 DIAGNOSIS — M791 Myalgia, unspecified site: Secondary | ICD-10-CM

## 2018-12-04 DIAGNOSIS — R293 Abnormal posture: Secondary | ICD-10-CM

## 2018-12-04 DIAGNOSIS — M62838 Other muscle spasm: Secondary | ICD-10-CM

## 2018-12-04 NOTE — Therapy (Signed)
[facility] MAIN [facility] SERVICES [address] [place], [place], [address] Phone: [phone]   Fax:  [phone]  Physical Therapy Treatment  The patient has been informed of current processes in place at Outpatient Rehab to protect patients from Covid-19 exposure including social distancing, schedule modifications, and new cleaning procedures. After discussing their particular risk with a therapist based on the patient's personal risk factors, the patient has decided to proceed with in-person therapy.   Patient Details  Name: Carrie DionesKari M Wong MRN: [id number] Date of Birth: [birth year] No data recorded  Encounter Date: 12/04/2018  PT End of Session - 12/04/18 0815    Visit Number  29    Number of Visits  42    Date for PT Re-Evaluation  01/15/19    Authorization - Visit Number  3    Authorization - Number of Visits  10    PT Start Time  0806    PT Stop Time  0900    PT Time Calculation (min)  54 min    Activity Tolerance  Patient tolerated treatment well    Behavior During Therapy  [facility] for tasks assessed/performed       Past Medical History:  Diagnosis Date  . Dysmenorrhea   . Hyperlipidemia   . Hypertension   . Tachycardia   . Vulvodynia     Past Surgical History:  Procedure Laterality Date  . EAR      TUBES    There were no vitals filed for this visit.    Pelvic Floor Therapy Treatment  SUBJECTIVE  Patient reports: Doing OK this morning. Some mid-back pain in the mid back. Exercises going ok, ~ 3-4 times per week.  Pain update:  Location of pain: mid-back Current pain:  0/10  Max pain: 2/10  Least pain: 0/10 Nature of pain:"a little ache-ness" Patient Goals: To decrease tightening in the PFM and loosen up.   OBJECTIVE  Changes in: Posture/Observations:  Hyperkyphosis more apparent than lordosis now.   ROM: Pt. Able to to achieve neutral pelvic tilt without bending knees today! Decreased  hyperkyphosis in standing following treatment.  Palpation: TTP to L>R hip ER's   INTERVENTIONS THIS SESSION: Manual: TP release to B OI, piriformis, to decrease spasm and pain and allow for improved balance of musculature for improved function and decreased symptoms. (36 minutes)   Dry-needle: Performed TPDN with a .30x4560mm needle and standard approach as described below to decrease spasm and pain and allow for improved balance of musculature for improved function and decreased symptoms.   ThereAct: Performance of functional work activities improved posterior pelvic tilt. Patient has preference for hip ER with functional squats for work. Minimal cues required to improved posterior weight shift. (10 minutes)    Total time: 54 minutes                     Trigger Point Dry Needling - 12/04/18 0001    Consent Given?  Yes    Education Handout Provided  No    Muscles Treated Back/Hip  Gluteus medius;Piriformis;Obturator internus    Dry Needling Comments  Bilaterally    Gluteus Medius Response  Twitch response elicited;Palpable increased muscle length    Piriformis Response  Twitch response elicited;Palpable increased muscle length    Obturator internus Response  Twitch response elicited;Palpable increased muscle length             PT Short Term Goals - 09/25/18 0933      PT  SHORT TERM GOAL #1   Title  Patient will demonstrate improved sitting and standing posture to demonstrate learning and decrease stress on the pelvic floor with functional activity.    Baseline  Anterior pelvic ,     Time  6    Period  Weeks    Status  Achieved    Target Date  05/22/18      PT SHORT TERM GOAL #2   Title  Patient will demonstrate improved pelvic alignment and balance of musculature surrounding the pelvis to facilitate decreased PFM spasms and decrease pelvic pain.    Baseline  L up-slip, R leg-long    Time  6    Period  Weeks    Status  Achieved    Target Date  11/29/18       PT SHORT TERM GOAL #3   Title  Patient will report a reduction in pain to no greater than 1/10 over the prior week to demonstrate symptom improvement.    Baseline  3/10 over past week (debilitating during infection), As of 1-31: Max of 2/10    Time  6    Period  Weeks    Status  Achieved    Target Date  05/22/18        PT Long Term Goals - 11/06/18 0927      PT LONG TERM GOAL #1   Title  Patient will report no pain with intercourse or other vaginal penetration to demonstrate improved functional ability.    Baseline  Pain with initial penetration    Time  12    Period  Weeks    Status  Achieved      PT LONG TERM GOAL #2   Title  Patient will score less than or equal to 15% on the Female NIH-CPSI to demonstrate a reduction in pain, urinary symptoms, and an improved quality of life.    Baseline  Female NIH-CPSI: 14/43 (33%),  VQ: 11/33 (33%) As of 2/14: VQ: 7/33 (15%), Female NIH-CPSI: 5/43 (%),  As of 3/6: 0%)     Time  12    Period  Weeks    Status  Achieved      PT LONG TERM GOAL #3   Title  Patient will be independent with HEP and stress-management routines in order to return to PLOF.    Time  12    Period  Weeks    Status  Achieved      PT LONG TERM GOAL #4   Title  Patient will describe pelvic pain no greater than 0/10 during normal daily activities to demonstrate improved functional ability.    Baseline  3/10 max, as of 2/14: 2/10   , as of 3/6:  0/10 ( switching medication to Cymbalta has helped)     Time  12    Period  Weeks    Status  Achieved      PT LONG TERM GOAL #5   Title  Pt will demo decreased mm tightness at R T/L junction with less posterior rotation of trunk in order to minimzie worsening of rotaional component to scoliosis     Time  4    Period  Weeks    Status  Achieved      PT LONG TERM GOAL #6   Title  Pt will be IND with HEP using body-weight, dumbell, and resistance bands and appropriate form to minimize risk of injury and returned spasms  with daily bending/lifting required for job function.  Baseline  Over-use of lumbar extensors and obliques, decreased recruitment of Glutes and TA and poor form.    Time  10    Period  Weeks    Status  Revised    Target Date  01/15/19      PT LONG TERM GOAL #7   Title  Pt will report management of mm tensions in back and pelvic floor with work and lifting at work across 3 months in order to have a self70management of pain    Time  20    Period  Weeks    Status  On-going    Target Date  01/15/19            Plan - 12/04/18 0815    Clinical Impression Statement Pt. Responded well to all interventions today, demonstrating improved muscle spasm relaxation along hip external rotators, improved carry over for squat performance with less anterior pelvic tilt and hip ER present, as well as understanding and correct performance of all education and exercises provided today. They will continue to benefit from skilled physical therapy to work toward remaining goals and maximize function as well as decrease likelihood of symptom increase or recurrence.    Stability/Clinical Decision Making  Stable/Uncomplicated    Rehab Potential  Good    PT Frequency  Other (comment)    PT Duration  Other (comment)    PT Treatment/Interventions  ADLs/Self Care Home Management;Biofeedback;Electrical Stimulation;Moist Heat;Functional mobility training;Therapeutic activities;Therapeutic exercise;Balance training;Patient/family education;Neuromuscular re-education;Manual techniques;Passive range of motion;Taping;Dry needling;Spinal Manipulations;Joint Manipulations    PT Next Visit Plan  Review squat form, practice tall-kneeling plank with serratus punch, half-kneeling psoas lengthening; pelvic neutral in functional positions.    PT Home Exercise Plan  Bird-dog, piriformis stretch, child's pose, kegels, diaphragmatic breathing, hip-flexor stretch, side-stretch, bow-and-arrow, side-plank, knee plank, hip ext, wall  squats, monster walks, seated and standing hip-flexor stretch, neural flossing for RLE, chest-press to decrease rib-flare.Marland Kitchen    Consulted and Agree with Plan of Care  Patient       Patient will benefit from skilled therapeutic intervention in order to improve the following deficits and impairments:  Increased fascial restricitons, Improper body mechanics, Pain, Hypermobility, Increased muscle spasms, Postural dysfunction, Decreased activity tolerance, Decreased range of motion, Decreased balance  Visit Diagnosis: Abnormal posture  Other muscle spasm  Myalgia     Problem List Patient Active Problem List   Diagnosis Date Noted  . Tachycardia   . Dysmenorrhea   . Vulvodynia   . Hypertension 08/26/2016   Cleophus Molt DPT, ATC Cleophus Molt 12/04/2018, 9:50 AM  [place] [facility] MAIN [facility] SERVICES [address] [place], [place], [address] Phone: [phone]   Fax:  [phone]  Name: DEDE DOBESH MRN: [id number] Date of Birth: 12/01/1985

## 2018-12-11 ENCOUNTER — Ambulatory Visit: Payer: 59 | Attending: Obstetrics & Gynecology

## 2018-12-11 ENCOUNTER — Other Ambulatory Visit: Payer: Self-pay

## 2018-12-11 DIAGNOSIS — M791 Myalgia, unspecified site: Secondary | ICD-10-CM

## 2018-12-11 DIAGNOSIS — R293 Abnormal posture: Secondary | ICD-10-CM | POA: Diagnosis not present

## 2018-12-11 DIAGNOSIS — M62838 Other muscle spasm: Secondary | ICD-10-CM | POA: Insufficient documentation

## 2018-12-11 NOTE — Patient Instructions (Signed)
  Keep your trunk as one unit and let it hinge forward from the hips as you push your bottom back and bend your knees at the same rate that you bend your hips. Keep your weight back toward your heels but do not actually lift the toes off the ground. Exhale starting just before and all the way through standing to help engage the glutes and lower tummy muscles.   Replace kneeling hip-hinge with chair squats, 3x10, 1 time per day. Line up your toes and knees facing forward, focus on squeezing the butt (not back) to come up    Use 8 lb. Weights, start with straight arms and press both arms up toward the ceiling using your shoulder blades. Do 3x15.     Come into half kneeling as described above (left picture), place one part of red band under the forward foot, the other in the opposite hand. Draw up tall, engaging the deep core.  bring your hand down toward your opposite hip and then draw a diagonal line, rotating through the trunk as you exhale.  Repeat _10x2__ times, _1__ times per day.   Do the releases below as needed for tightness/pain or if you feel "jelly leg"     This is The QL muscle  To perform release on this muscle, start by getting into this position by bridging the hips up and then slowly lowering your back, then your butt down to lengthen the low back then put the ball under you where you feel the tender spot and roll to the same side slightly to add pressure as needed. Hold still and take deep breaths until the pain is at least 50% less or, ideally, just pressure.   This is your piriformis    To release this muscle start in this position with your ankle crossed over the opposite knee. Place the tennis ball under your buttock where the tender spot is and then slightly roll your weight to the same side to put just enough pressure that it is uncomfortable. Hold and take deep breaths until the pain is at least 50% less or, ideally ,just pressure.   This is your Gluteus Medius and  Minimus  To perform release on this muscle, start by getting into this position by bridging the hips up and then slowly lowering your back, then your butt down to lengthen the low back then put the ball under you where you feel the tender spot and roll to the same side slightly to add pressure as needed. Hold still and take deep breaths until the pain is at least 50% less or, ideally, just pressure.   These are your deep hip-flexor muscles. They are easiest to reach where they come together at the hip.   To perform release on this muscle, start by getting into this position by laying on your stomach then put the ball under you where you feel the tender spot and bring the opposite knee up/out to the side to add pressure as needed. Hold still and take deep breaths until the pain is at least 50% less or, ideally ,just pressure.

## 2018-12-11 NOTE — Therapy (Signed)
[facility] [address] [place], [place], [id number] Phone: [phone]   Fax:  [phone]  Physical Therapy Treatment The patient has been informed of current processes in place at Outpatient Rehab to protect patients from Covid-19 exposure including social distancing, schedule modifications, and new cleaning procedures. After discussing their particular risk with a therapist based on the patient's personal risk factors, the patient has decided to proceed with in-person therapy.   Patient Details  Name: Carrie Kennedy MRN: [id number] Date of Birth: [birth year] No data recorded  Encounter Date: 12/11/2018  PT End of Session - 12/11/18 1102    Visit Number  30    Number of Visits  42    Date for PT Re-Evaluation  01/15/19    Authorization - Visit Number  4    Authorization - Number of Visits  10    PT Start Time  0808    PT Stop Time  0902    PT Time Calculation (min)  54 min    Activity Tolerance  Patient tolerated treatment well    Behavior During Therapy  [facility] for tasks assessed/performed       Past Medical History:  Diagnosis Date  . Dysmenorrhea   . Hyperlipidemia   . Hypertension   . Tachycardia   . Vulvodynia     Past Surgical History:  Procedure Laterality Date  . EAR      TUBES    There were no vitals filed for this visit.  Pelvic Floor Therapy Treatment  SUBJECTIVE  Patient reports: Doing well, felt a spasm under her R shoulder blade when doing stretches the other night but it went away. Has been remembering to use good form with lifting animals but sometimes her knees hurt when she has to kneel for long.  Pain update:  Location of pain: mid-back Current pain:  0/10  Max pain: 2/10  Least pain: 0/10 Nature of pain:"a little ache-ness"  Patient Goals: To decrease tightening in the PFM and loosen up.   OBJECTIVE  Changes in: Posture/Observations:  Hyperkyphosis more apparent  than lordosis now.   ROM: Pt. Able to squat to 90 deg. Without ER at the hip   INTERVENTIONS THIS SESSION:   Therex: Attempted serratus punches in knee plank position but D/C'd due to poor proprioception and recruitment. Educated on and practiced serratus punches in hook-lying for improved postural strengthening/ decreased kyphosis. Reviewed squat mechanics and replaced kneeling hip-hinges with chair squats to increase glute strength through a greater and more functional ROM. Educuated on and practiced half-kneeling active psoas lengthening with red band to decrease deep hip-flexor tightness and hyperlordosis.    ThereAct: Educated on and practiced self TP release to L Piriformis, QL, Glute Med. And Max to decrease spasm and pain and allow for improved balance of musculature for improved function and decreased symptoms. Reviewed and educated on body mechanics for work activities to prevent re-injury and improve deep-core activation.     Total time: 54 minutes                              PT Short Term Goals - 09/25/18 0933      PT SHORT TERM GOAL #1   Title  Patient will demonstrate improved sitting and standing posture to demonstrate learning and decrease stress on the pelvic floor with functional activity.    Baseline  Anterior pelvic ,  Time  6    Period  Weeks    Status  Achieved    Target Date  05/22/18      PT SHORT TERM GOAL #2   Title  Patient will demonstrate improved pelvic alignment and balance of musculature surrounding the pelvis to facilitate decreased PFM spasms and decrease pelvic pain.    Baseline  L up-slip, R leg-long    Time  6    Period  Weeks    Status  Achieved    Target Date  11/29/18      PT SHORT TERM GOAL #3   Title  Patient will report a reduction in pain to no greater than 1/10 over the prior week to demonstrate symptom improvement.    Baseline  3/10 over past week (debilitating during infection), As of 1-31: Max of  2/10    Time  6    Period  Weeks    Status  Achieved    Target Date  05/22/18        PT Long Term Goals - 11/06/18 0927      PT LONG TERM GOAL #1   Title  Patient will report no pain with intercourse or other vaginal penetration to demonstrate improved functional ability.    Baseline  Pain with initial penetration    Time  12    Period  Weeks    Status  Achieved      PT LONG TERM GOAL #2   Title  Patient will score less than or equal to 15% on the Female NIH-CPSI to demonstrate a reduction in pain, urinary symptoms, and an improved quality of life.    Baseline  Female NIH-CPSI: 14/43 (33%),  VQ: 11/33 (33%) As of 2/14: VQ: 7/33 (15%), Female NIH-CPSI: 5/43 (%),  As of 3/6: 0%)     Time  12    Period  Weeks    Status  Achieved      PT LONG TERM GOAL #3   Title  Patient will be independent with HEP and stress-management routines in order to return to PLOF.    Time  12    Period  Weeks    Status  Achieved      PT LONG TERM GOAL #4   Title  Patient will describe pelvic pain no greater than 0/10 during normal daily activities to demonstrate improved functional ability.    Baseline  3/10 max, as of 2/14: 2/10   , as of 3/6:  0/10 ( switching medication to Cymbalta has helped)     Time  12    Period  Weeks    Status  Achieved      PT LONG TERM GOAL #5   Title  Pt will demo decreased mm tightness at R T/L junction with less posterior rotation of trunk in order to minimzie worsening of rotaional component to scoliosis     Time  4    Period  Weeks    Status  Achieved      PT LONG TERM GOAL #6   Title  Pt will be IND with HEP using body-weight, dumbell, and resistance bands and appropriate form to minimize risk of injury and returned spasms with daily bending/lifting required for job function.    Baseline  Over-use of lumbar extensors and obliques, decreased recruitment of Glutes and TA and poor form.    Time  10    Period  Weeks    Status  Revised    Target Date  01/15/19       PT LONG TERM GOAL #7   Title  Pt will report management of mm tensions in back and pelvic floor with work and lifting at work across 3 months in order to have a self73management of pain    Time  20    Period  Weeks    Status  On-going    Target Date  01/15/19            Plan - 12/11/18 1102    Clinical Impression Statement  Pt. Responded well to all interventions today, demonstrating improved proprioception of appropriate squat mechanics and ability to achieve pelvic neutral in kneeling/half kneeling with VC only as well as understanding and correct performance of all education and exercises provided today. They will continue to benefit from skilled physical therapy to work toward remaining goals and maximize function as well as decrease likelihood of symptom increase or recurrence.     Stability/Clinical Decision Making  Stable/Uncomplicated    Rehab Potential  Good    PT Frequency  Other (comment)    PT Duration  Other (comment)    PT Treatment/Interventions  ADLs/Self Care Home Management;Biofeedback;Electrical Stimulation;Moist Heat;Functional mobility training;Therapeutic activities;Therapeutic exercise;Balance training;Patient/family education;Neuromuscular re-education;Manual techniques;Passive range of motion;Taping;Dry needling;Spinal Manipulations;Joint Manipulations    PT Next Visit Plan  Review squat form, practice tall-kneeling plank with serratus punch, half-kneeling psoas lengthening; pelvic neutral in functional positions.    PT Home Exercise Plan  Bird-dog, piriformis stretch, child's pose, kegels, diaphragmatic breathing, hip-flexor stretch, side-stretch, bow-and-arrow, side-plank, knee plank, hip ext, wall squats, monster walks, seated and standing hip-flexor stretch, neural flossing for RLE, chest-press to decrease rib-flare.Marland Kitchen    Consulted and Agree with Plan of Care  Patient       Patient will benefit from skilled therapeutic intervention in order to improve the  following deficits and impairments:  Increased fascial restricitons, Improper body mechanics, Pain, Hypermobility, Increased muscle spasms, Postural dysfunction, Decreased activity tolerance, Decreased range of motion, Decreased balance  Visit Diagnosis: Abnormal posture  Other muscle spasm  Myalgia     Problem List Patient Active Problem List   Diagnosis Date Noted  . Tachycardia   . Dysmenorrhea   . Vulvodynia   . Hypertension 08/26/2016   Cleophus Molt DPT, ATC Cleophus Molt 12/11/2018, 11:35 AM  [facility] MAIN [facility] SERVICES [address] [place], [place], [address] Phone: [phone]   Fax:  [phone]  Name: Carrie Kennedy MRN: [id number] Date of Birth: 03-01-1985

## 2018-12-18 ENCOUNTER — Other Ambulatory Visit: Payer: Self-pay

## 2018-12-18 ENCOUNTER — Ambulatory Visit: Payer: 59

## 2018-12-18 DIAGNOSIS — R293 Abnormal posture: Secondary | ICD-10-CM | POA: Diagnosis not present

## 2018-12-18 DIAGNOSIS — M62838 Other muscle spasm: Secondary | ICD-10-CM

## 2018-12-18 DIAGNOSIS — M791 Myalgia, unspecified site: Secondary | ICD-10-CM

## 2018-12-18 NOTE — Patient Instructions (Signed)
Look up "The Bad Yogi"  Tips for combating Cymbalta side-effects:  -take 3 deep breaths when your watch tells you to.  -take a 5 min. Walk  -When clogging or other activities make sure when you "stand tall" that you engage your deep core and do not press your chest forward so much your back arches.   *[facility] has a 12 week walking schedule you can use to progress    -When walking, keep knees soft and ~50% tuck at pelvis but walk at your comfortable speed.  -When exercising, focus on "exhale with exertion" not letting your low back arch as you press overhead, row, etc.

## 2018-12-18 NOTE — Therapy (Signed)
[facility] [address] [place], [place], [address] Phone: [phone]   Fax:  [phone]  Physical Therapy Treatment  The patient has been informed of current processes in place at Outpatient Rehab to protect patients from Covid-19 exposure including social distancing, schedule modifications, and new cleaning procedures. After discussing their particular risk with a therapist based on the patient's personal risk factors, the patient has decided to proceed with in-person therapy.   Patient Details  Name: Carrie DionesKari M Kennedy MRN: [id number] Date of Birth: [birth year] No data recorded  Encounter Date: 12/18/2018  PT End of Session - 12/18/18 0819    Visit Number  31    Number of Visits  42    Date for PT Re-Evaluation  01/15/19    Authorization - Visit Number  5    Authorization - Number of Visits  10    PT Start Time  0806    PT Stop Time  0903    PT Time Calculation (min)  57 min    Activity Tolerance  Patient tolerated treatment well    Behavior During Therapy  [facility] for tasks assessed/performed       Past Medical History:  Diagnosis Date  . Dysmenorrhea   . Hyperlipidemia   . Hypertension   . Tachycardia   . Vulvodynia     Past Surgical History:  Procedure Laterality Date  . EAR      TUBES    There were no vitals filed for this visit.  Pelvic Floor Therapy Treatment  SUBJECTIVE  Patient reports: Is doing well at home. No pain, some exercises are getting easier, others are still challenging. The rib flare correction still requires a lot of attention. Is afraid that she is going to feel pain or be out of breath while exercising which has decreased her motivation for exercise. Cymbalta has also  Decreased her motivation, is unhappy with her weight gain of ~ 30 lbs.   Pain update:  Location of pain: mid-back Current pain:  0/10  Max pain: 0/10  Least pain: 0/10 Nature of pain:"a little  ache-ness"  Patient Goals: To decrease tightening in the PFM and loosen up.  *be able to walk into work without having to turn a fan on and being out of breath"   OBJECTIVE  Changes in: Posture/Observations:  Pt. Demonstrates improved ability to self-correct with min. VC for soft knees and neutral pelvis  Gait assessment:  Heel-strike with locked knees, anterior pelvic tilt.    INTERVENTIONS THIS SESSION:  Therex: Reviewed kneeling planks and modified to elbows instead of wrists, educated on and practiced seated overhead press and forward leaning rows with cues for appropriate pelvic position and core recruitment. Educated on cue for core engagement rather than pressing the chest out when doing clogging to allow Pt. To engage in meaningful exercise for weight loss without increasing pain and spasm for lasting pain relief.   ThereAct: Educated on and practiced gait mechanics with emphasis on cues to soften knees and keep pelvis neutral to allow for transfer of energy through the body rather than storage in the pelvis leading to tension. Educated on ways to combat side-effects of Cymbalta including taking 5 min walks regularly, taking vitamin D, breathing into the belly briefly on her own rather than with the guided breathing exercise when her watch prompts her to, etc. To improve her motivation and energy to perform exercises for therapy and for weight loss. .Marland Kitchen  Total time: 60 minutes                             PT Short Term Goals - 09/25/18 0933      PT SHORT TERM GOAL #1   Title  Patient will demonstrate improved sitting and standing posture to demonstrate learning and decrease stress on the pelvic floor with functional activity.    Baseline  Anterior pelvic ,     Time  6    Period  Weeks    Status  Achieved    Target Date  05/22/18      PT SHORT TERM GOAL #2   Title  Patient will demonstrate improved pelvic alignment and balance of  musculature surrounding the pelvis to facilitate decreased PFM spasms and decrease pelvic pain.    Baseline  L up-slip, R leg-long    Time  6    Period  Weeks    Status  Achieved    Target Date  11/29/18      PT SHORT TERM GOAL #3   Title  Patient will report a reduction in pain to no greater than 1/10 over the prior week to demonstrate symptom improvement.    Baseline  3/10 over past week (debilitating during infection), As of 1-31: Max of 2/10    Time  6    Period  Weeks    Status  Achieved    Target Date  05/22/18        PT Long Term Goals - 11/06/18 0927      PT LONG TERM GOAL #1   Title  Patient will report no pain with intercourse or other vaginal penetration to demonstrate improved functional ability.    Baseline  Pain with initial penetration    Time  12    Period  Weeks    Status  Achieved      PT LONG TERM GOAL #2   Title  Patient will score less than or equal to 15% on the Female NIH-CPSI to demonstrate a reduction in pain, urinary symptoms, and an improved quality of life.    Baseline  Female NIH-CPSI: 14/43 (33%),  VQ: 11/33 (33%) As of 2/14: VQ: 7/33 (15%), Female NIH-CPSI: 5/43 (%),  As of 3/6: 0%)     Time  12    Period  Weeks    Status  Achieved      PT LONG TERM GOAL #3   Title  Patient will be independent with HEP and stress-management routines in order to return to PLOF.    Time  12    Period  Weeks    Status  Achieved      PT LONG TERM GOAL #4   Title  Patient will describe pelvic pain no greater than 0/10 during normal daily activities to demonstrate improved functional ability.    Baseline  3/10 max, as of 2/14: 2/10   , as of 3/6:  0/10 ( switching medication to Cymbalta has helped)     Time  12    Period  Weeks    Status  Achieved      PT LONG TERM GOAL #5   Title  Pt will demo decreased mm tightness at R T/L junction with less posterior rotation of trunk in order to minimzie worsening of rotaional component to scoliosis     Time  4    Period   Weeks    Status  Achieved  PT LONG TERM GOAL #6   Title  Pt will be IND with HEP using body-weight, dumbell, and resistance bands and appropriate form to minimize risk of injury and returned spasms with daily bending/lifting required for job function.    Baseline  Over-use of lumbar extensors and obliques, decreased recruitment of Glutes and TA and poor form.    Time  10    Period  Weeks    Status  Revised    Target Date  01/15/19      PT LONG TERM GOAL #7   Title  Pt will report management of mm tensions in back and pelvic floor with work and lifting at work across 3 months in order to have a self40management of pain    Time  20    Period  Weeks    Status  On-going    Target Date  01/15/19            Plan - 12/18/18 0820    Clinical Impression Statement  Pt. Responded well to all interventions today, demonstrating improved understanding and performance of planks and pushing/pulling exercises with appropriate form as well as understanding and correct performance of all education provided today. They will continue to benefit from skilled physical therapy to work toward remaining goals and maximize function as well as decrease likelihood of symptom increase or recurrence.     Stability/Clinical Decision Making  Stable/Uncomplicated    Rehab Potential  Good    PT Frequency  Other (comment)    PT Duration  Other (comment)    PT Treatment/Interventions  ADLs/Self Care Home Management;Biofeedback;Electrical Stimulation;Moist Heat;Functional mobility training;Therapeutic activities;Therapeutic exercise;Balance training;Patient/family education;Neuromuscular re-education;Manual techniques;Passive range of motion;Taping;Dry needling;Spinal Manipulations;Joint Manipulations    PT Next Visit Plan  Review squat form, practice tall-kneeling plank with serratus punch, half-kneeling psoas lengthening; pelvic neutral in functional positions.    PT Home Exercise Plan  Bird-dog, piriformis  stretch, child's pose, kegels, diaphragmatic breathing, hip-flexor stretch, side-stretch, bow-and-arrow, side-plank, knee plank, hip ext, wall squats, monster walks, seated and standing hip-flexor stretch, neural flossing for RLE, chest-press to decrease rib-flare.Marland Kitchen    Consulted and Agree with Plan of Care  Patient       Patient will benefit from skilled therapeutic intervention in order to improve the following deficits and impairments:  Increased fascial restricitons, Improper body mechanics, Pain, Hypermobility, Increased muscle spasms, Postural dysfunction, Decreased activity tolerance, Decreased range of motion, Decreased balance  Visit Diagnosis: Abnormal posture  Other muscle spasm  Myalgia     Problem List Patient Active Problem List   Diagnosis Date Noted  . Tachycardia   . Dysmenorrhea   . Vulvodynia   . Hypertension 08/26/2016   Cleophus Molt DPT, ATC Cleophus Molt 12/18/2018, 10:31 AM  [facility] MAIN [facility] SERVICES [address] [place], [place], [address] Phone: [phone]   Fax:  [phone]  Name: DOROTHY POLHEMUS MRN: [id number] Date of Birth: 1985/09/29

## 2018-12-25 ENCOUNTER — Other Ambulatory Visit: Payer: Self-pay

## 2018-12-25 ENCOUNTER — Ambulatory Visit: Payer: 59

## 2018-12-25 DIAGNOSIS — R293 Abnormal posture: Secondary | ICD-10-CM | POA: Diagnosis not present

## 2018-12-25 DIAGNOSIS — M791 Myalgia, unspecified site: Secondary | ICD-10-CM

## 2018-12-25 DIAGNOSIS — M62838 Other muscle spasm: Secondary | ICD-10-CM

## 2018-12-25 NOTE — Therapy (Signed)
[facility] [address] [place], [place], [address] Phone: [phone]   Fax:  [phone]  Physical Therapy Treatment  The patient has been informed of current processes in place at Outpatient Rehab to protect patients from Covid-19 exposure including social distancing, schedule modifications, and new cleaning procedures. After discussing their particular risk with a therapist based on the patient's personal risk factors, the patient has decided to proceed with in-person therapy.   Patient Details  Name: Carrie Kennedy MRN: [id number] Date of Birth: 09-29-1985 No data recorded  Encounter Date: 12/25/2018  PT End of Session - 12/25/18 0956    Visit Number  32    Number of Visits  42    Date for PT Re-Evaluation  01/15/19    Authorization - Visit Number  6    Authorization - Number of Visits  10    PT Start Time  0813    PT Stop Time  0906    PT Time Calculation (min)  53 min    Activity Tolerance  Patient tolerated treatment well    Behavior During Therapy  [facility] for tasks assessed/performed       Past Medical History:  Diagnosis Date  . Dysmenorrhea   . Hyperlipidemia   . Hypertension   . Tachycardia   . Vulvodynia     Past Surgical History:  Procedure Laterality Date  . EAR      TUBES    There were no vitals filed for this visit.  Pelvic Floor Therapy Treatment  SUBJECTIVE  Patient reports: "a little sore" in the low back. She had more patients this week. She has been really aware of her posture and walking, is correcting it more.  Pain update:  Location of pain: mid-back Current pain:  2/10  Max pain: 3/10  Least pain: 0/10 Nature of pain:"a little ache-ness" or sore  Patient Goals: To decrease tightening in the PFM and loosen up.  *be able to walk into work without having to turn a fan on and being out of breath"   OBJECTIVE  Changes in: Posture/Observations:  Pt. Continues  to trend toward anterior pelvic tilt but corrects well with min. Cues.  When she becomes fatigued she falls into a pattern of shoulders elevating and anterior pelvic tilt.    INTERVENTIONS THIS SESSION:  Therex: Educated on and practiced modified boat pose with one foot touching at a time and a 10 second hold x4 and standing with forward lean/lunge and serratus punches using band as an alternative to plank due to Pt. Dislike and low compliance with plank exercises. Practiced and reviewed seated rows with band to teach deep core engagement/timing for optimizing exercise and strength.    Total time: 53 minutes                                PT Short Term Goals - 09/25/18 0933      PT SHORT TERM GOAL #1   Title  Patient will demonstrate improved sitting and standing posture to demonstrate learning and decrease stress on the pelvic floor with functional activity.    Baseline  Anterior pelvic ,     Time  6    Period  Weeks    Status  Achieved    Target Date  05/22/18      PT SHORT TERM GOAL #2   Title  Patient will demonstrate improved pelvic  alignment and balance of musculature surrounding the pelvis to facilitate decreased PFM spasms and decrease pelvic pain.    Baseline  L up-slip, R leg-long    Time  6    Period  Weeks    Status  Achieved    Target Date  11/29/18      PT SHORT TERM GOAL #3   Title  Patient will report a reduction in pain to no greater than 1/10 over the prior week to demonstrate symptom improvement.    Baseline  3/10 over past week (debilitating during infection), As of 1-31: Max of 2/10    Time  6    Period  Weeks    Status  Achieved    Target Date  05/22/18        PT Long Term Goals - 11/06/18 0927      PT LONG TERM GOAL #1   Title  Patient will report no pain with intercourse or other vaginal penetration to demonstrate improved functional ability.    Baseline  Pain with initial penetration    Time  12    Period  Weeks     Status  Achieved      PT LONG TERM GOAL #2   Title  Patient will score less than or equal to 15% on the Female NIH-CPSI to demonstrate a reduction in pain, urinary symptoms, and an improved quality of life.    Baseline  Female NIH-CPSI: 14/43 (33%),  VQ: 11/33 (33%) As of 2/14: VQ: 7/33 (15%), Female NIH-CPSI: 5/43 (%),  As of 3/6: 0%)     Time  12    Period  Weeks    Status  Achieved      PT LONG TERM GOAL #3   Title  Patient will be independent with HEP and stress-management routines in order to return to PLOF.    Time  12    Period  Weeks    Status  Achieved      PT LONG TERM GOAL #4   Title  Patient will describe pelvic pain no greater than 0/10 during normal daily activities to demonstrate improved functional ability.    Baseline  3/10 max, as of 2/14: 2/10   , as of 3/6:  0/10 ( switching medication to Cymbalta has helped)     Time  12    Period  Weeks    Status  Achieved      PT LONG TERM GOAL #5   Title  Pt will demo decreased mm tightness at R T/L junction with less posterior rotation of trunk in order to minimzie worsening of rotaional component to scoliosis     Time  4    Period  Weeks    Status  Achieved      PT LONG TERM GOAL #6   Title  Pt will be IND with HEP using body-weight, dumbell, and resistance bands and appropriate form to minimize risk of injury and returned spasms with daily bending/lifting required for job function.    Baseline  Over-use of lumbar extensors and obliques, decreased recruitment of Glutes and TA and poor form.    Time  10    Period  Weeks    Status  Revised    Target Date  01/15/19      PT LONG TERM GOAL #7   Title  Pt will report management of mm tensions in back and pelvic floor with work and lifting at work across 3 months in order to have a  self32management of pain    Time  20    Period  Weeks    Status  On-going    Target Date  01/15/19            Plan - 12/25/18 0957    Clinical Impression Statement  Pt. Responded well  to all interventions today, demonstrating improved deep-core engagement and resolution of pain through strengthening alone as well as understanding and correct performance of all education and exercises provided today. They will continue to benefit from skilled physical therapy to work toward remaining goals and maximize function as well as decrease likelihood of symptom increase or recurrence.     Stability/Clinical Decision Making  Stable/Uncomplicated    Clinical Decision Making  Low    Rehab Potential  Good    PT Frequency  Other (comment)    PT Duration  Other (comment)    PT Treatment/Interventions  ADLs/Self Care Home Management;Biofeedback;Electrical Stimulation;Moist Heat;Functional mobility training;Therapeutic activities;Therapeutic exercise;Balance training;Patient/family education;Neuromuscular re-education;Manual techniques;Passive range of motion;Taping;Dry needling;Spinal Manipulations;Joint Manipulations    PT Next Visit Plan  Review squat form, practice boat pose and plank serratus punches in lunge with band, half-kneeling psoas lengthening; pelvic neutral in functional positions.    PT Home Exercise Plan  Bird-dog, piriformis stretch, child's pose, kegels, diaphragmatic breathing, hip-flexor stretch, side-stretch, bow-and-arrow, side-plank, knee plank, hip ext, wall squats, monster walks, seated and standing hip-flexor stretch, neural flossing for RLE, chest-press to decrease rib-flare, standing plank with serratus punch/bands, modified boat pose    Consulted and Agree with Plan of Care  Patient       Patient will benefit from skilled therapeutic intervention in order to improve the following deficits and impairments:  Increased fascial restricitons, Improper body mechanics, Pain, Hypermobility, Increased muscle spasms, Postural dysfunction, Decreased activity tolerance, Decreased range of motion, Decreased balance  Visit Diagnosis: Abnormal posture  Other muscle  spasm  Myalgia     Problem List Patient Active Problem List   Diagnosis Date Noted  . Tachycardia   . Dysmenorrhea   . Vulvodynia   . Hypertension 08/26/2016   Cleophus Molt DPT, ATC Cleophus Molt 12/25/2018, 10:28 AM  [facility] MAIN [facility] SERVICES [address] [place], [place], [address] Phone: [phone]   Fax:  [phone]  Name: Carrie Kennedy MRN: [id number] Date of Birth: 1985-07-19

## 2018-12-25 NOTE — Patient Instructions (Signed)
Boat Pose  1. Try to hold for about 30 seconds or until you feel fatigued/ body starts to compensate.  2. Use your hands to work your self back up. Feel stability under your feet.     Work up to 30-45 second holds but start with 10 seconds, lifting one foot at a time to challenge your balance  "Plank/lunge/punches"  -Look at video on your phone, do 3 sets of ~ 6-8 to start, increasing repetitions as your endurance improves.

## 2018-12-30 ENCOUNTER — Encounter: Payer: Self-pay | Admitting: Obstetrics and Gynecology

## 2019-01-06 ENCOUNTER — Other Ambulatory Visit: Payer: Self-pay | Admitting: Obstetrics and Gynecology

## 2019-01-06 MED ORDER — NORETHINDRONE 0.35 MG PO TABS: 1.0000 | ORAL_TABLET | Freq: Every day | ORAL | 2 refills | Status: DC

## 2019-01-06 NOTE — Progress Notes (Signed)
Rx POP for BC. Hx of HTN.

## 2019-01-22 ENCOUNTER — Other Ambulatory Visit: Payer: Self-pay

## 2019-01-22 ENCOUNTER — Ambulatory Visit: Payer: 59 | Attending: Obstetrics & Gynecology

## 2019-01-22 DIAGNOSIS — M791 Myalgia, unspecified site: Secondary | ICD-10-CM | POA: Diagnosis present

## 2019-01-22 DIAGNOSIS — M62838 Other muscle spasm: Secondary | ICD-10-CM | POA: Diagnosis present

## 2019-01-22 DIAGNOSIS — R293 Abnormal posture: Secondary | ICD-10-CM | POA: Diagnosis not present

## 2019-01-22 NOTE — Therapy (Addendum)
[facility] MAIN [facility] SERVICES [address] [place], [place], [address] Phone: [phone]   Fax:  [phone]  Physical Therapy Treatment  The patient has been informed of current processes in place at Outpatient Rehab to protect patients from Covid-19 exposure including social distancing, schedule modifications, and new cleaning procedures. After discussing their particular risk with a therapist based on the patient's personal risk factors, the patient has decided to proceed with in-person therapy.   Patient Details  Name: Carrie Kennedy MRN: [id number] Date of Birth: 1985-04-19 No data recorded  Encounter Date: 01/22/2019  PT End of Session - 01/28/19 0953    Visit Number  33    Number of Visits  42    Date for PT Re-Evaluation  01/15/19    Authorization - Visit Number  6    Authorization - Number of Visits  10    PT Start Time  0812    PT Stop Time  0912    PT Time Calculation (min)  60 min    Activity Tolerance  Patient tolerated treatment well    Behavior During Therapy  [facility] for tasks assessed/performed       Past Medical History:  Diagnosis Date  . Dysmenorrhea   . Hyperlipidemia   . Hypertension   . Tachycardia   . Vulvodynia     Past Surgical History:  Procedure Laterality Date  . EAR      TUBES    There were no vitals filed for this visit.  Pelvic Floor Therapy Treatment  SUBJECTIVE  Patient reports: Last couple days has had a little soreness just because it has been busy at work but is doing well overall. Is able to use her exercises to decrease her pain when it increases. New years resolutions are to get enough sleep and increasing her exercise. Liked doing classes (pre-covid)   Pain update:  Location of pain: mid-back Current pain:  0/10  Max pain: 2/10  Least pain: 0/10 Nature of pain:"a little ache-ness" or sore  Patient Goals: To decrease tightening in the PFM and loosen up.  *be able to  walk into work without having to turn a fan on and being out of breath"   OBJECTIVE  Changes in: Posture/Observations:  Pt. Continues to trend toward anterior pelvic tilt but corrects well with min. Cues.  When she becomes fatigued she falls into a pattern of shoulders elevating and anterior pelvic tilt. Pt. Demonstrates difficulty performing lunge with proper form due to decreased glute firing. Tendency to lose pelvic neutral if focusing on more than one area or motion.    INTERVENTIONS THIS SESSION:  Therex: Educated on and practiced  How to perform common group-fitness body-weight or lightweight exercises to help pt. Understand precautions to prevent return of Sx. As she is working on tring to improve fitness and lose weight. Reviewed squats, lunges, overhead press, crunches, leg-lifts, push-ups, triceps/lat rows, overhead triceps extensions, etc. And gave cues for proper performance, educating on how to down-modify for herself and work at appropriate intensity level with good form rather than pushing too hard and using poor form.  Self-care: discussed how to position with intercourse to allow for control of depth/rate of penetration and how to prepare mentally and physically to return to intercourse to prevent vaginismus and allow for pain-free intercourse.  Total time: 60 minutes  PT Short Term Goals - 09/25/18 0933      PT SHORT TERM GOAL #1   Title  Patient will demonstrate improved sitting and standing posture to demonstrate learning and decrease stress on the pelvic floor with functional activity.    Baseline  Anterior pelvic ,     Time  6    Period  Weeks    Status  Achieved    Target Date  05/22/18      PT SHORT TERM GOAL #2   Title  Patient will demonstrate improved pelvic alignment and balance of musculature surrounding the pelvis to facilitate decreased PFM spasms and decrease pelvic pain.    Baseline  L up-slip,  R leg-long    Time  6    Period  Weeks    Status  Achieved    Target Date  11/29/18      PT SHORT TERM GOAL #3   Title  Patient will report a reduction in pain to no greater than 1/10 over the prior week to demonstrate symptom improvement.    Baseline  3/10 over past week (debilitating during infection), As of 1-31: Max of 2/10    Time  6    Period  Weeks    Status  Achieved    Target Date  05/22/18        PT Long Term Goals - 11/06/18 0927      PT LONG TERM GOAL #1   Title  Patient will report no pain with intercourse or other vaginal penetration to demonstrate improved functional ability.    Baseline  Pain with initial penetration    Time  12    Period  Weeks    Status  Achieved      PT LONG TERM GOAL #2   Title  Patient will score less than or equal to 15% on the Female NIH-CPSI to demonstrate a reduction in pain, urinary symptoms, and an improved quality of life.    Baseline  Female NIH-CPSI: 14/43 (33%),  VQ: 11/33 (33%) As of 2/14: VQ: 7/33 (15%), Female NIH-CPSI: 5/43 (%),  As of 3/6: 0%)     Time  12    Period  Weeks    Status  Achieved      PT LONG TERM GOAL #3   Title  Patient will be independent with HEP and stress-management routines in order to return to PLOF.    Time  12    Period  Weeks    Status  Achieved      PT LONG TERM GOAL #4   Title  Patient will describe pelvic pain no greater than 0/10 during normal daily activities to demonstrate improved functional ability.    Baseline  3/10 max, as of 2/14: 2/10   , as of 3/6:  0/10 ( switching medication to Cymbalta has helped)     Time  12    Period  Weeks    Status  Achieved      PT LONG TERM GOAL #5   Title  Pt will demo decreased mm tightness at R T/L junction with less posterior rotation of trunk in order to minimzie worsening of rotaional component to scoliosis     Time  4    Period  Weeks    Status  Achieved      PT LONG TERM GOAL #6   Title  Pt will be IND with HEP using body-weight, dumbell,  and resistance bands and appropriate form to minimize risk of  injury and returned spasms with daily bending/lifting required for job function.    Baseline  Over-use of lumbar extensors and obliques, decreased recruitment of Glutes and TA and poor form.    Time  10    Period  Weeks    Status  Revised    Target Date  01/15/19      PT LONG TERM GOAL #7   Title  Pt will report management of mm tensions in back and pelvic floor with work and lifting at work across 3 months in order to have a self78management of pain    Time  20    Period  Weeks    Status  On-going    Target Date  01/15/19            Plan - 01/28/19 0953    Clinical Impression Statement Pt. Responded well to all interventions today, demonstrating improved understanding and performance of all education and exercises provided today. She has maintained low pain for a few weeks now but continues to  Have some muscular imbalance and poor mechanics with certain movements though there is definite improvement. They will continue to benefit from skilled physical therapy to work toward remaining goals and maximize function as well as decrease likelihood of symptom increase or recurrence.     Stability/Clinical Decision Making  Stable/Uncomplicated    Rehab Potential  Good    PT Frequency  Other (comment)    PT Duration  Other (comment)    PT Treatment/Interventions  ADLs/Self Care Home Management;Biofeedback;Electrical Stimulation;Moist Heat;Functional mobility training;Therapeutic activities;Therapeutic exercise;Balance training;Patient/family education;Neuromuscular re-education;Manual techniques;Passive range of motion;Taping;Dry needling;Spinal Manipulations;Joint Manipulations    PT Next Visit Plan  re-assess goals, review HEP, answer questions about home exercise attempts, problem solve for dyspareunia PRN? D/C vs re-cert    PT Home Exercise Plan  Bird-dog, piriformis stretch, child's pose, kegels, diaphragmatic breathing,  hip-flexor stretch, side-stretch, bow-and-arrow, side-plank, knee plank, hip ext, wall squats, monster walks, seated and standing hip-flexor stretch, neural flossing for RLE, chest-press to decrease rib-flare, standing plank with serratus punch/bands, modified boat pose    Consulted and Agree with Plan of Care  Patient       Patient will benefit from skilled therapeutic intervention in order to improve the following deficits and impairments:  Increased fascial restricitons, Improper body mechanics, Pain, Hypermobility, Increased muscle spasms, Postural dysfunction, Decreased activity tolerance, Decreased range of motion, Decreased balance  Visit Diagnosis: Abnormal posture  Other muscle spasm  Myalgia     Problem List Patient Active Problem List   Diagnosis Date Noted  . Tachycardia   . Dysmenorrhea   . Vulvodynia   . Hypertension 08/26/2016   Willa Rough DPT, ATC Willa Rough 01/28/2019, 10:10 AM  [place] MAIN [facility] SERVICES [address] [place], [place], [address] Phone: [phone]   Fax:  [phone]  Name: DANALEE FLATH MRN: [id number] Date of Birth: 01-12-1986

## 2019-02-12 ENCOUNTER — Ambulatory Visit: Payer: 59 | Attending: Obstetrics & Gynecology

## 2019-02-12 ENCOUNTER — Other Ambulatory Visit: Payer: Self-pay

## 2019-02-12 DIAGNOSIS — M791 Myalgia, unspecified site: Secondary | ICD-10-CM | POA: Diagnosis present

## 2019-02-12 DIAGNOSIS — M62838 Other muscle spasm: Secondary | ICD-10-CM

## 2019-02-12 DIAGNOSIS — R293 Abnormal posture: Secondary | ICD-10-CM

## 2019-02-12 NOTE — Therapy (Signed)
[facility] MAIN [facility] SERVICES [address] [place], [place], [address] Phone: [phone]   Fax:  [phone]  Physical Therapy Treatment  The patient has been informed of current processes in place at Outpatient Rehab to protect patients from Covid-19 exposure including social distancing, schedule modifications, and new cleaning procedures. After discussing their particular risk with a therapist based on the patient's personal risk factors, the patient has decided to proceed with in-person therapy.   Patient Details  Name: Carrie Kennedy MRN: [id number] Date of Birth: 1985-11-19 No data recorded  Encounter Date: 02/12/2019  PT End of Session - 02/12/19 1206    Visit Number  34    Number of Visits  42    Date for PT Re-Evaluation  01/15/19    Authorization - Visit Number  7    Authorization - Number of Visits  10    PT Start Time  1108    PT Stop Time  1201    PT Time Calculation (min)  53 min    Activity Tolerance  Patient tolerated treatment well    Behavior During Therapy  [facility] for tasks assessed/performed       Past Medical History:  Diagnosis Date  . Dysmenorrhea   . Hyperlipidemia   . Hypertension   . Tachycardia   . Vulvodynia     Past Surgical History:  Procedure Laterality Date  . EAR      TUBES    There were no vitals filed for this visit.   Pelvic Floor Therapy Treatment  SUBJECTIVE  Patient reports: Is doing "ok" yesterday she was having to lift a lot of big dogs, she did, stretches/heat/ice and it seems to have calmed down. Has not had intercourse yet, has not had any pelvic spasms in a couple months. Had a massage that focused on her shoulder blade and upper back and it seems like this helped.  She is more aware of fixing her posture ~ 80-85% of the time now.  Pain update:  Location of pain: mid-back Current pain:  0/10  Max pain: 4/10  Least pain: 0/10 Nature of pain:"a little ache-ness" or  sore  Patient Goals: To decrease tightening in the PFM and loosen up.  *be able to walk into work without having to turn a fan on and being out of breath"   OBJECTIVE  Changes in: Posture/Observations:  Pt. Continues to trend toward anterior pelvic tilt but corrects well with min. Cues.  When she becomes fatigued she falls into a pattern of shoulders elevating and anterior pelvic tilt. Pt. Demonstrates difficulty performing lunge with proper form due to decreased glute firing. Tendency to lose pelvic neutral if focusing on more than one area or motion.    INTERVENTIONS THIS SESSION:  Therex: Educated on and practiced  How to perform common group-fitness body-weight or lightweight exercises to help pt. Understand precautions to prevent return of Sx. As she is working on tring to improve fitness and lose weight. Reviewed squats, lunges, overhead press, crunches, leg-lifts, push-ups, triceps/lat rows, overhead triceps extensions, etc. And gave cues for proper performance, educating on how to down-modify for herself and work at appropriate intensity level with good form rather than pushing too hard and using poor form.  Self-care: discussed how to position with intercourse to allow for control of depth/rate of penetration and how to prepare mentally and physically to return to intercourse to prevent vaginismus and allow for pain-free intercourse.  Total time: 60 minutes  PT Short Term Goals - 09/25/18 0933      PT SHORT TERM GOAL #1   Title  Patient will demonstrate improved sitting and standing posture to demonstrate learning and decrease stress on the pelvic floor with functional activity.    Baseline  Anterior pelvic ,     Time  6    Period  Weeks    Status  Achieved    Target Date  05/22/18      PT SHORT TERM GOAL #2   Title  Patient will demonstrate improved pelvic alignment and balance of musculature surrounding  the pelvis to facilitate decreased PFM spasms and decrease pelvic pain.    Baseline  L up-slip, R leg-long    Time  6    Period  Weeks    Status  Achieved    Target Date  11/29/18      PT SHORT TERM GOAL #3   Title  Patient will report a reduction in pain to no greater than 1/10 over the prior week to demonstrate symptom improvement.    Baseline  3/10 over past week (debilitating during infection), As of 1-31: Max of 2/10    Time  6    Period  Weeks    Status  Achieved    Target Date  05/22/18        PT Long Term Goals - 02/12/19 1121      PT LONG TERM GOAL #1   Title  Patient will report no pain with intercourse or other vaginal penetration to demonstrate improved functional ability.    Baseline  Pain with initial penetration    Time  12    Period  Weeks    Status  Achieved    Target Date  05/22/18      PT LONG TERM GOAL #2   Title  Patient will score less than or equal to 15% on the Female NIH-CPSI to demonstrate a reduction in pain, urinary symptoms, and an improved quality of life.    Baseline  Female NIH-CPSI: 14/43 (33%),  VQ: 11/33 (33%) As of 2/14: VQ: 7/33 (15%), Female NIH-CPSI: 5/43 (%),  As of 3/6: 0%)     Time  12    Period  Weeks    Status  Achieved    Target Date  05/22/18      PT LONG TERM GOAL #3   Title  Patient will be independent with HEP and stress-management routines in order to return to PLOF.    Time  12    Period  Weeks    Status  Achieved    Target Date  02/27/18      PT LONG TERM GOAL #4   Title  Patient will describe pelvic pain no greater than 0/10 during normal daily activities to demonstrate improved functional ability.    Baseline  3/10 max, as of 2/14: 2/10   , as of 3/6:  0/10 ( switching medication to Cymbalta has helped)     Time  12    Period  Weeks    Status  Achieved    Target Date  05/22/18      PT LONG TERM GOAL #5   Title  Pt will demo decreased mm tightness at R T/L junction with less posterior rotation of trunk in order  to minimzie worsening of rotaional component to scoliosis     Time  4    Period  Weeks    Status  Achieved    Target Date  12/10/18      PT LONG TERM GOAL #6   Title  Pt will be IND with HEP using body-weight, dumbell, and resistance bands and appropriate form to minimize risk of injury and returned spasms with daily bending/lifting required for job function.    Baseline  Over-use of lumbar extensors and obliques, decreased recruitment of Glutes and TA and poor form.    Time  10    Period  Weeks    Status  Revised    Target Date  01/15/19      PT LONG TERM GOAL #7   Title  Pt will report management of mm tensions in back and pelvic floor with work and lifting at work across 3 months in order to have a self75management of pain    Time  20    Period  Weeks    Status  Achieved    Target Date  01/15/19              Patient will benefit from skilled therapeutic intervention in order to improve the following deficits and impairments:     Visit Diagnosis: Abnormal posture  Other muscle spasm  Myalgia     Problem List Patient Active Problem List   Diagnosis Date Noted  . Tachycardia   . Dysmenorrhea   . Vulvodynia   . Hypertension 08/26/2016   Cleophus Molt DPT, ATC Cleophus Molt 02/12/2019, 12:09 PM  [facility] MAIN [facility] SERVICES [address] [place], [place], [address] Phone: [phone]   Fax:  [phone]  Name: Carrie Kennedy MRN: [id number] Date of Birth: Jan 08, 1986

## 2019-02-12 NOTE — Patient Instructions (Signed)
Intimate Rose internal trigger point release tool $29.99$59.99  Dr. Joel Kaplan Premium Prostate Massager   $19.99  Self Internal Trigger Point Relief    1) Wash your hands and prop yourself up in a way where you can easily reach the vagina. You may wish to have a small hand-held mirror near by.  2) lubricate the tool and vaginal opening using a hypoallergenic lubricant such as "slippery-stuff".   3) Slowly and gently insert the tool into the vagina using deep breaths to allow relaxation of the muscles around the tool.  4) Avoiding the "12 o-clock" region near the urethra, gently use the handle of the tool like a lever to press the angled tip of the tool onto the wall of the pelvic floor.   5) Move the tool to different areas of the pelvic floor and feel for areas that are tender called "trigger points". When you find one hold the tool still, applying just enough pressure to elicit mild discomfort and take deep belly breaths until the discomfort subsides or decreases by at least 50%.   6) Repeat the process for any trigger points you find spending between 3-10 minutes on this per night until you do not find any more trigger points or you are told otherwise by your therapist..    

## 2019-03-20 ENCOUNTER — Ambulatory Visit: Payer: 59 | Attending: Internal Medicine

## 2019-03-20 ENCOUNTER — Ambulatory Visit: Payer: 59

## 2019-03-20 DIAGNOSIS — Z23 Encounter for immunization: Secondary | ICD-10-CM | POA: Insufficient documentation

## 2019-03-20 NOTE — Progress Notes (Signed)
   Covid-19 Vaccination Clinic  Name:  Carrie Kennedy    MRN: [id number] DOB: [birth year]  03/20/2019  Carrie Kennedy was observed post Covid-19 immunization for 15 minutes without incidence. She was provided with Vaccine Information Sheet and instruction to access the V-Safe system.   Carrie Kennedy was instructed to call 911 with any severe reactions post vaccine: Marland Kitchen Difficulty breathing  . Swelling of your face and throat  . A fast heartbeat  . A bad rash all over your body  . Dizziness and weakness    Immunizations Administered    Name Date Dose VIS Date Route   Pfizer COVID-19 Vaccine 03/20/2019  9:55 AM 0.3 mL 01/15/2019 Intramuscular   Manufacturer: [employer], [employer]   Lot: [id number]   NDC: [address]-6

## 2019-04-10 ENCOUNTER — Ambulatory Visit: Payer: 59 | Attending: Internal Medicine

## 2019-04-10 DIAGNOSIS — Z23 Encounter for immunization: Secondary | ICD-10-CM | POA: Insufficient documentation

## 2019-04-10 NOTE — Progress Notes (Signed)
   Covid-19 Vaccination Clinic  Name:  ANNALIE WENNER    MRN: [id number] DOB: [birth year]  04/10/2019  Ms. Jimerson was observed post Covid-19 immunization for 15 minutes without incident. She was provided with Vaccine Information Sheet and instruction to access the V-Safe system.   Ms. Iwai was instructed to call 911 with any severe reactions post vaccine: Marland Kitchen Difficulty breathing  . Swelling of face and throat  . A fast heartbeat  . A bad rash all over body  . Dizziness and weakness   Immunizations Administered    Name Date Dose VIS Date Route   Pfizer COVID-19 Vaccine 04/10/2019 10:14 AM 0.3 mL 01/15/2019 Intramuscular   Manufacturer: [employer], [employer]   Lot: [id number]   NDC: [address]-5

## 2019-08-25 NOTE — Progress Notes (Signed)
PCP:  Gale Journey, MD   Chief Complaint  Patient presents with  . Gynecologic Exam     HPI:      Ms. Carrie Kennedy is a 34 y.o. G0P0000 who LMP was Patient's last menstrual period was 08/08/2019 (exact date)., presents today for her annual examination.  Her menses are regular every 28-30 days, lasting 5-7 days.  Dysmenorrhea mild, occurring first 1-2 days of flow. She does not have intermenstrual bleeding. On POPs now.  Sex activity: currently sexually active--contraception: POPs. 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 Last Pap: 06/05/17  Results were: no abnormalities /neg HPV DNA  Hx of STDs: none  There is a FH of breast cancer in her MGM, genetic testing not indicated. There is a FH of ovarian cancer in her mat 2nd cousin. The patient does do self-breast exams.  Tobacco use: The patient denies current or previous tobacco use. Alcohol use: social drinker No drug use.  Exercise: moderately active  She does get adequate calcium but not Vitamin D in her diet.  Pt on cymbalta for vulvodynia, followed by MD at [facility]. Also has estrogen/lidocaine crm prn. Hx of BV in past, doing well with probiotics.   Also on propranolol for HTN, followed by PCP. Has anxiety/depression. Sees therapist, on cymbalta.  Past Medical History:  Diagnosis Date  . Dysmenorrhea   . Hyperlipidemia   . Hypertension   . Tachycardia   . Vulvodynia     Past Surgical History:  Procedure Laterality Date  . EAR      TUBES    Family History  Problem Relation Age of Onset  . Hypertension Mother   . Heart disease Mother   . Prostate cancer Father   . Hypertension Father   . Breast cancer Maternal Grandmother 29    Social History   Socioeconomic History  . Marital status: Single    Spouse name: Not on file  . Number of children: Not on file  . Years of education: Not on file  . Highest education level: Not on file  Occupational History  . Not on file  Tobacco Use  . Smoking  status: Never Smoker  . Smokeless tobacco: Never Used  Vaping Use  . Vaping Use: Never used  Substance and Sexual Activity  . Alcohol use: Yes  . Drug use: No  . Sexual activity: Yes    Birth control/protection: Pill  Other Topics Concern  . Not on file  Social History Narrative  . Not on file   Social Determinants of Health   Financial Resource Strain:   . Difficulty of Paying Living Expenses:   Food Insecurity:   . Worried About Programme researcher, broadcasting/film/video in the Last Year:   . Barista in the Last Year:   Transportation Needs:   . Freight forwarder (Medical):   Marland Kitchen Lack of Transportation (Non-Medical):   Physical Activity:   . Days of Exercise per Week:   . Minutes of Exercise per Session:   Stress:   . Feeling of Stress :   Social Connections:   . Frequency of Communication with Friends and Family:   . Frequency of Social Gatherings with Friends and Family:   . Attends Religious Services:   . Active Member of Clubs or Organizations:   . Attends Banker Meetings:   Marland Kitchen Marital Status:   Intimate Partner Violence:   . Fear of Current or Ex-Partner:   . Emotionally Abused:   .  Physically Abused:   . Sexually Abused:     Outpatient Medications Prior to Visit  Medication Sig Dispense Refill  . azelastine (ASTELIN) 0.1 % nasal spray Place into the nose.    . Azelastine-Fluticasone (DYMISTA) 137-50 MCG/ACT SUSP 1 spray by Each Nare route Two (2) times a day.    . diazepam (VALIUM) 5 MG tablet Insert one tablet into the vagina at bedtime    . DULoxetine (CYMBALTA) 60 MG capsule Take 60 mg by mouth daily.    . Multiple Vitamins-Minerals (MULTIVITAMIN GUMMIES WOMENS) CHEW     . propranolol (INDERAL) 20 MG tablet TAKE 1 TABLET (20 MG TOTAL) BY MOUTH TWO (2) TIMES A DAY.    Marland Kitchen norethindrone (MICRONOR) 0.35 MG tablet Take 1 tablet (0.35 mg total) by mouth daily. 84 tablet 2  . cholecalciferol (VITAMIN D) 1000 units tablet Take 1,000 Units by mouth daily.    .  magnesium oxide (MAG-OX) 400 MG tablet Take by mouth.     No facility-administered medications prior to visit.      ROS:  Review of Systems  Constitutional: Negative for fatigue, fever and unexpected weight change.  Respiratory: Negative for cough, shortness of breath and wheezing.   Cardiovascular: Negative for chest pain, palpitations and leg swelling.  Gastrointestinal: Negative for blood in stool, constipation, diarrhea, nausea and vomiting.  Endocrine: Negative for cold intolerance, heat intolerance and polyuria.  Genitourinary: Negative for dyspareunia, dysuria, flank pain, frequency, genital sores, hematuria, menstrual problem, pelvic pain, urgency, vaginal bleeding, vaginal discharge and vaginal pain.  Musculoskeletal: Negative for back pain, joint swelling and myalgias.  Skin: Negative for rash.  Neurological: Negative for dizziness, syncope, light-headedness, numbness and headaches.  Hematological: Negative for adenopathy.  Psychiatric/Behavioral: Positive for agitation and dysphoric mood. Negative for confusion, sleep disturbance and suicidal ideas. The patient is not nervous/anxious.   BREAST: No symptoms   Objective: BP 120/90   Ht 5\' 4"  (1.626 m)   Wt 174 lb (78.9 kg)   LMP 08/08/2019 (Exact Date)   BMI 29.87 kg/m    Physical Exam Constitutional:      Appearance: She is well-developed.  Genitourinary:     Vulva, vagina, uterus, right adnexa and left adnexa normal.     No vulval lesion or tenderness noted.     No vaginal discharge, erythema or tenderness.     No cervical motion tenderness or polyp.     Uterus is not enlarged or tender.     No right or left adnexal mass present.     Right adnexa not tender.     Left adnexa not tender.  Neck:     Thyroid: No thyromegaly.  Cardiovascular:     Rate and Rhythm: Normal rate and regular rhythm.     Heart sounds: Normal heart sounds. No murmur heard.   Pulmonary:     Effort: Pulmonary effort is normal.      Breath sounds: Normal breath sounds.  Chest:     Breasts:        Right: No mass, nipple discharge, skin change or tenderness.        Left: No mass, nipple discharge, skin change or tenderness.  Abdominal:     Palpations: Abdomen is soft.     Tenderness: There is no abdominal tenderness. There is no guarding.  Musculoskeletal:        General: Normal range of motion.     Cervical back: Normal range of motion.  Neurological:     General: No focal  deficit present.     Mental Status: She is alert and oriented to person, place, and time.     Cranial Nerves: No cranial nerve deficit.  Skin:    General: Skin is warm and dry.  Psychiatric:        Mood and Affect: Mood normal.        Behavior: Behavior normal.        Thought Content: Thought content normal.        Judgment: Judgment normal.  Vitals reviewed.     Assessment/Plan: Encounter for annual routine gynecological examination  Encounter for surveillance of contraceptive pills - Plan: norethindrone (MICRONOR) 0.35 MG tablet; OCP RF.   Vulvodynia--followed at [facility].           GYN counsel adequate intake of calcium and vitamin D, diet and exercise     F/U  Return in about 1 year (around 08/25/2020).  Raniya Golembeski B. Alaya Iverson, PA-C 08/26/2019 8:56 AM

## 2019-08-26 ENCOUNTER — Ambulatory Visit (INDEPENDENT_AMBULATORY_CARE_PROVIDER_SITE_OTHER): Payer: 59 | Admitting: Obstetrics and Gynecology

## 2019-08-26 ENCOUNTER — Other Ambulatory Visit: Payer: Self-pay

## 2019-08-26 ENCOUNTER — Encounter: Payer: Self-pay | Admitting: Obstetrics and Gynecology

## 2019-08-26 VITALS — BP 120/90 | Ht 64.0 in | Wt 174.0 lb

## 2019-08-26 DIAGNOSIS — Z01419 Encounter for gynecological examination (general) (routine) without abnormal findings: Secondary | ICD-10-CM | POA: Diagnosis not present

## 2019-08-26 DIAGNOSIS — N94819 Vulvodynia, unspecified: Secondary | ICD-10-CM

## 2019-08-26 DIAGNOSIS — Z3041 Encounter for surveillance of contraceptive pills: Secondary | ICD-10-CM

## 2019-08-26 MED ORDER — NORETHINDRONE 0.35 MG PO TABS: 1.0000 | ORAL_TABLET | Freq: Every day | ORAL | 3 refills | Status: DC

## 2019-08-26 NOTE — Patient Instructions (Signed)
I value your feedback and entrusting us with your care. If you get a [facility] patient survey, I would appreciate you taking the time to let us know about your experience today. Thank you!  As of January 14, 2019, your lab results will be released to your MyChart immediately, before I even have a chance to see them. Please give me time to review them and contact you if there are any abnormalities. Thank you for your patience.  

## 2020-03-24 DIAGNOSIS — F900 Attention-deficit hyperactivity disorder, predominantly inattentive type: Secondary | ICD-10-CM | POA: Diagnosis not present

## 2020-03-24 DIAGNOSIS — F3341 Major depressive disorder, recurrent, in partial remission: Secondary | ICD-10-CM | POA: Diagnosis not present

## 2020-03-24 DIAGNOSIS — F411 Generalized anxiety disorder: Secondary | ICD-10-CM | POA: Diagnosis not present

## 2020-04-07 DIAGNOSIS — F900 Attention-deficit hyperactivity disorder, predominantly inattentive type: Secondary | ICD-10-CM | POA: Diagnosis not present

## 2020-04-07 DIAGNOSIS — F411 Generalized anxiety disorder: Secondary | ICD-10-CM | POA: Diagnosis not present

## 2020-04-07 DIAGNOSIS — F3341 Major depressive disorder, recurrent, in partial remission: Secondary | ICD-10-CM | POA: Diagnosis not present

## 2020-04-21 ENCOUNTER — Ambulatory Visit (INDEPENDENT_AMBULATORY_CARE_PROVIDER_SITE_OTHER): Payer: BC Managed Care – PPO | Admitting: Internal Medicine

## 2020-04-21 ENCOUNTER — Encounter: Payer: Self-pay | Admitting: Internal Medicine

## 2020-04-21 ENCOUNTER — Other Ambulatory Visit: Payer: Self-pay

## 2020-04-21 VITALS — BP 114/62 | HR 80 | Temp 99.0°F | Resp 18 | Ht 64.0 in | Wt 150.6 lb

## 2020-04-21 DIAGNOSIS — J309 Allergic rhinitis, unspecified: Secondary | ICD-10-CM | POA: Insufficient documentation

## 2020-04-21 DIAGNOSIS — F32A Depression, unspecified: Secondary | ICD-10-CM | POA: Insufficient documentation

## 2020-04-21 DIAGNOSIS — F325 Major depressive disorder, single episode, in full remission: Secondary | ICD-10-CM

## 2020-04-21 DIAGNOSIS — Z0000 Encounter for general adult medical examination without abnormal findings: Secondary | ICD-10-CM | POA: Diagnosis not present

## 2020-04-21 DIAGNOSIS — I10 Essential (primary) hypertension: Secondary | ICD-10-CM | POA: Diagnosis not present

## 2020-04-21 DIAGNOSIS — N946 Dysmenorrhea, unspecified: Secondary | ICD-10-CM

## 2020-04-21 DIAGNOSIS — J301 Allergic rhinitis due to pollen: Secondary | ICD-10-CM

## 2020-04-21 DIAGNOSIS — E559 Vitamin D deficiency, unspecified: Secondary | ICD-10-CM

## 2020-04-21 LAB — COMPREHENSIVE METABOLIC PANEL
ALT: 13 U/L (ref 0–35)
AST: 17 U/L (ref 0–37)
Albumin: 4.3 g/dL (ref 3.5–5.2)
Alkaline Phosphatase: 63 U/L (ref 39–117)
BUN: 14 mg/dL (ref 6–23)
CO2: 29 mEq/L (ref 19–32)
Calcium: 9.8 mg/dL (ref 8.4–10.5)
Chloride: 103 mEq/L (ref 96–112)
Creatinine, Ser: 0.62 mg/dL (ref 0.40–1.20)
GFR: 114.96 mL/min (ref >60.00)
Glucose, Bld: 68 mg/dL — ABNORMAL LOW (ref 70–99)
Potassium: 3.8 mEq/L (ref 3.5–5.1)
Sodium: 139 mEq/L (ref 135–145)
Total Bilirubin: 0.5 mg/dL (ref 0.2–1.2)
Total Protein: 7.6 g/dL (ref 6.0–8.3)

## 2020-04-21 LAB — CBC
HCT: 39.6 % (ref 36.0–46.0)
Hemoglobin: 13.5 g/dL (ref 12.0–15.0)
MCHC: 34 g/dL (ref 30.0–36.0)
MCV: 85.6 fl (ref 78.0–100.0)
Platelets: 380 10*3/uL (ref 150.0–400.0)
RBC: 4.63 Mil/uL (ref 3.87–5.11)
RDW: 13.6 % (ref 11.5–15.5)
WBC: 8.9 10*3/uL (ref 4.0–10.5)

## 2020-04-21 LAB — LIPID PANEL
Cholesterol: 177 mg/dL (ref 0–200)
HDL: 48.2 mg/dL (ref >39.00)
LDL Cholesterol: 121 mg/dL — ABNORMAL HIGH (ref 0–99)
NonHDL: 129.1
Total CHOL/HDL Ratio: 4
Triglycerides: 43 mg/dL (ref 0.0–149.0)
VLDL: 8.6 mg/dL (ref 0.0–40.0)

## 2020-04-21 LAB — VITAMIN D 25 HYDROXY (VIT D DEFICIENCY, FRACTURES): VITD: 55.02 ng/mL (ref 30.00–100.00)

## 2020-04-21 LAB — TSH: TSH: 3.08 u[IU]/mL (ref 0.35–4.50)

## 2020-04-21 NOTE — Assessment & Plan Note (Signed)
Checking vitamin D level and adjust as needed.  

## 2020-04-21 NOTE — Assessment & Plan Note (Signed)
BP at goal on propranolol 20 mg BID. Checking labs and adjust as needed.

## 2020-04-21 NOTE — Assessment & Plan Note (Signed)
Taking flonase and can refill as needed.

## 2020-04-21 NOTE — Assessment & Plan Note (Signed)
Taking cymbalta 60 mg daily and overall well controlled. Will continue and she does continue with therapy also several times per month.

## 2020-04-21 NOTE — Assessment & Plan Note (Signed)
Taking birth control from gyn and overall satisfied. Does have some pelvic floor dysfunction and has done PT for that in the past.

## 2020-04-21 NOTE — Assessment & Plan Note (Signed)
Flu shot declines. Covid-19 up to date including booster. Tetanus unsure if up to date will get records. Breast exam with gyn, pap smear with gyn up to date will get records. Counseled about sun safety and mole surveillance. Counseled about the dangers of distracted driving. Given 10 year screening recommendations.

## 2020-04-21 NOTE — Patient Instructions (Signed)
Health Maintenance, Female Adopting a healthy lifestyle and getting preventive care are important in promoting health and wellness. Ask your health care provider about:  The right schedule for you to have regular tests and exams.  Things you can do on your own to prevent diseases and keep yourself healthy. What should I know about diet, weight, and exercise? Eat a healthy diet  Eat a diet that includes plenty of vegetables, fruits, low-fat dairy products, and lean protein.  Do not eat a lot of foods that are high in solid fats, added sugars, or sodium.   Maintain a healthy weight Body mass index (BMI) is used to identify weight problems. It estimates body fat based on height and weight. Your health care provider can help determine your BMI and help you achieve or maintain a healthy weight. Get regular exercise Get regular exercise. This is one of the most important things you can do for your health. Most adults should:  Exercise for at least 150 minutes each week. The exercise should increase your heart rate and make you sweat (moderate-intensity exercise).  Do strengthening exercises at least twice a week. This is in addition to the moderate-intensity exercise.  Spend less time sitting. Even light physical activity can be beneficial. Watch cholesterol and blood lipids Have your blood tested for lipids and cholesterol at 36 years of age, then have this test every 5 years. Have your cholesterol levels checked more often if:  Your lipid or cholesterol levels are high.  You are older than 36 years of age.  You are at high risk for heart disease. What should I know about cancer screening? Depending on your health history and family history, you may need to have cancer screening at various ages. This may include screening for:  Breast cancer.  Cervical cancer.  Colorectal cancer.  Skin cancer.  Lung cancer. What should I know about heart disease, diabetes, and high blood  pressure? Blood pressure and heart disease  High blood pressure causes heart disease and increases the risk of stroke. This is more likely to develop in people who have high blood pressure readings, are of African descent, or are overweight.  Have your blood pressure checked: ? Every 3-5 years if you are 18-39 years of age. ? Every year if you are 40 years old or older. Diabetes Have regular diabetes screenings. This checks your fasting blood sugar level. Have the screening done:  Once every three years after age 40 if you are at a normal weight and have a low risk for diabetes.  More often and at a younger age if you are overweight or have a high risk for diabetes. What should I know about preventing infection? Hepatitis B If you have a higher risk for hepatitis B, you should be screened for this virus. Talk with your health care provider to find out if you are at risk for hepatitis B infection. Hepatitis C Testing is recommended for:  Everyone born from 1945 through 1965.  Anyone with known risk factors for hepatitis C. Sexually transmitted infections (STIs)  Get screened for STIs, including gonorrhea and chlamydia, if: ? You are sexually active and are younger than 36 years of age. ? You are older than 36 years of age and your health care provider tells you that you are at risk for this type of infection. ? Your sexual activity has changed since you were last screened, and you are at increased risk for chlamydia or gonorrhea. Ask your health care provider   if you are at risk.  Ask your health care provider about whether you are at high risk for HIV. Your health care provider may recommend a prescription medicine to help prevent HIV infection. If you choose to take medicine to prevent HIV, you should first get tested for HIV. You should then be tested every 3 months for as long as you are taking the medicine. Pregnancy  If you are about to stop having your period (premenopausal) and  you may become pregnant, seek counseling before you get pregnant.  Take 400 to 800 micrograms (mcg) of folic acid every day if you become pregnant.  Ask for birth control (contraception) if you want to prevent pregnancy. Osteoporosis and menopause Osteoporosis is a disease in which the bones lose minerals and strength with aging. This can result in bone fractures. If you are 65 years old or older, or if you are at risk for osteoporosis and fractures, ask your health care provider if you should:  Be screened for bone loss.  Take a calcium or vitamin D supplement to lower your risk of fractures.  Be given hormone replacement therapy (HRT) to treat symptoms of menopause. Follow these instructions at home: Lifestyle  Do not use any products that contain nicotine or tobacco, such as cigarettes, e-cigarettes, and chewing tobacco. If you need help quitting, ask your health care provider.  Do not use street drugs.  Do not share needles.  Ask your health care provider for help if you need support or information about quitting drugs. Alcohol use  Do not drink alcohol if: ? Your health care provider tells you not to drink. ? You are pregnant, may be pregnant, or are planning to become pregnant.  If you drink alcohol: ? Limit how much you use to 0-1 drink a day. ? Limit intake if you are breastfeeding.  Be aware of how much alcohol is in your drink. In the [place] one drink equals one 12 oz bottle of beer (355 mL), one 5 oz glass of wine (148 mL), or one 1 oz glass of hard liquor (44 mL). General instructions  Schedule regular health, dental, and eye exams.  Stay current with your vaccines.  Tell your health care provider if: ? You often feel depressed. ? You have ever been abused or do not feel safe at home. Summary  Adopting a healthy lifestyle and getting preventive care are important in promoting health and wellness.  Follow your health care provider's instructions about healthy  diet, exercising, and getting tested or screened for diseases.  Follow your health care provider's instructions on monitoring your cholesterol and blood pressure. This information is not intended to replace advice given to you by your health care provider. Make sure you discuss any questions you have with your health care provider. Document Revised: 01/14/2018 Document Reviewed: 01/14/2018 Elsevier Patient Education  2021 Elsevier Inc.  

## 2020-04-21 NOTE — Progress Notes (Signed)
   Subjective:   Patient ID: Carrie Kennedy, female    DOB: Dec 13, 1985, 35 y.o.   MRN: [id number]  HPI The patient is a new 35 YO female coming in for continuation of depression (SI episode significant mid 76s, got on medication and did some therapy and now is improved, still taking cymbalta and doing therapy twice a month, she is struggling some emotionally with feeling like she is not exactly where she wanted to be at this age, she is buying house soon with boyfriend) and allergic rhinitis (takes flonase, overall satisfied with control, denies current symptoms such as fevers or chills or sinus pain), and hypertension (taking propranolol 20 mg BID, this is helping well, denies side effects) and weight (she is on diet plan and is down about 30 pounds, would like to lose another 20 pounds, she denies current exercise but is planning to start this soon).   She does also want physical while here today.  PMH, [facility], social history reviewed and updated  Review of Systems  Constitutional: Negative.   HENT: Negative.   Eyes: Negative.   Respiratory: Negative for cough, chest tightness and shortness of breath.   Cardiovascular: Negative for chest pain, palpitations and leg swelling.  Gastrointestinal: Negative for abdominal distention, abdominal pain, constipation, diarrhea, nausea and vomiting.  Musculoskeletal: Negative.   Skin: Negative.   Neurological: Negative.   Psychiatric/Behavioral: Negative.     Objective:  Physical Exam Constitutional:      Appearance: She is well-developed.  HENT:     Head: Normocephalic and atraumatic.  Cardiovascular:     Rate and Rhythm: Normal rate and regular rhythm.  Pulmonary:     Effort: Pulmonary effort is normal. No respiratory distress.     Breath sounds: Normal breath sounds. No wheezing or rales.  Abdominal:     General: Bowel sounds are normal. There is no distension.     Palpations: Abdomen is soft.     Tenderness: There is no abdominal  tenderness. There is no rebound.  Musculoskeletal:     Cervical back: Normal range of motion.  Skin:    General: Skin is warm and dry.  Neurological:     Mental Status: She is alert and oriented to person, place, and time.     Coordination: Coordination normal.     Vitals:   04/21/20 1255  BP: 114/62  Pulse: 80  Resp: 18  Temp: 99 F (37.2 C)  TempSrc: Oral  SpO2: 99%  Weight: 150 lb 9.6 oz (68.3 kg)  Height: 5\' 4"  (1.626 m)    This visit occurred during the SARS-CoV-2 public health emergency.  Safety protocols were in place, including screening questions prior to the visit, additional usage of staff PPE, and extensive cleaning of exam room while observing appropriate contact time as indicated for disinfecting solutions.   Assessment & Plan:

## 2020-04-23 LAB — HIV ANTIBODY (ROUTINE TESTING W REFLEX): HIV 1&2 Ab, 4th Generation: NONREACTIVE

## 2020-05-05 DIAGNOSIS — F411 Generalized anxiety disorder: Secondary | ICD-10-CM | POA: Diagnosis not present

## 2020-05-05 DIAGNOSIS — F3341 Major depressive disorder, recurrent, in partial remission: Secondary | ICD-10-CM | POA: Diagnosis not present

## 2020-05-05 DIAGNOSIS — F900 Attention-deficit hyperactivity disorder, predominantly inattentive type: Secondary | ICD-10-CM | POA: Diagnosis not present

## 2020-05-19 DIAGNOSIS — F411 Generalized anxiety disorder: Secondary | ICD-10-CM | POA: Diagnosis not present

## 2020-05-19 DIAGNOSIS — F3341 Major depressive disorder, recurrent, in partial remission: Secondary | ICD-10-CM | POA: Diagnosis not present

## 2020-05-19 DIAGNOSIS — F900 Attention-deficit hyperactivity disorder, predominantly inattentive type: Secondary | ICD-10-CM | POA: Diagnosis not present

## 2020-05-26 DIAGNOSIS — J309 Allergic rhinitis, unspecified: Secondary | ICD-10-CM | POA: Diagnosis not present

## 2020-05-26 DIAGNOSIS — J343 Hypertrophy of nasal turbinates: Secondary | ICD-10-CM | POA: Diagnosis not present

## 2020-05-26 DIAGNOSIS — J342 Deviated nasal septum: Secondary | ICD-10-CM | POA: Diagnosis not present

## 2020-06-02 DIAGNOSIS — F3341 Major depressive disorder, recurrent, in partial remission: Secondary | ICD-10-CM | POA: Diagnosis not present

## 2020-06-02 DIAGNOSIS — F900 Attention-deficit hyperactivity disorder, predominantly inattentive type: Secondary | ICD-10-CM | POA: Diagnosis not present

## 2020-06-02 DIAGNOSIS — F411 Generalized anxiety disorder: Secondary | ICD-10-CM | POA: Diagnosis not present

## 2020-06-16 DIAGNOSIS — F3341 Major depressive disorder, recurrent, in partial remission: Secondary | ICD-10-CM | POA: Diagnosis not present

## 2020-06-16 DIAGNOSIS — F411 Generalized anxiety disorder: Secondary | ICD-10-CM | POA: Diagnosis not present

## 2020-06-16 DIAGNOSIS — F900 Attention-deficit hyperactivity disorder, predominantly inattentive type: Secondary | ICD-10-CM | POA: Diagnosis not present

## 2020-06-23 DIAGNOSIS — F411 Generalized anxiety disorder: Secondary | ICD-10-CM | POA: Diagnosis not present

## 2020-06-23 DIAGNOSIS — F3341 Major depressive disorder, recurrent, in partial remission: Secondary | ICD-10-CM | POA: Diagnosis not present

## 2020-06-23 DIAGNOSIS — F900 Attention-deficit hyperactivity disorder, predominantly inattentive type: Secondary | ICD-10-CM | POA: Diagnosis not present

## 2020-06-30 DIAGNOSIS — F411 Generalized anxiety disorder: Secondary | ICD-10-CM | POA: Diagnosis not present

## 2020-06-30 DIAGNOSIS — F3341 Major depressive disorder, recurrent, in partial remission: Secondary | ICD-10-CM | POA: Diagnosis not present

## 2020-06-30 DIAGNOSIS — F900 Attention-deficit hyperactivity disorder, predominantly inattentive type: Secondary | ICD-10-CM | POA: Diagnosis not present

## 2020-07-07 ENCOUNTER — Ambulatory Visit: Payer: BC Managed Care – PPO | Admitting: Internal Medicine

## 2020-07-07 DIAGNOSIS — F900 Attention-deficit hyperactivity disorder, predominantly inattentive type: Secondary | ICD-10-CM | POA: Diagnosis not present

## 2020-07-07 DIAGNOSIS — F411 Generalized anxiety disorder: Secondary | ICD-10-CM | POA: Diagnosis not present

## 2020-07-07 DIAGNOSIS — F3341 Major depressive disorder, recurrent, in partial remission: Secondary | ICD-10-CM | POA: Diagnosis not present

## 2020-07-21 DIAGNOSIS — J309 Allergic rhinitis, unspecified: Secondary | ICD-10-CM | POA: Diagnosis not present

## 2020-07-21 DIAGNOSIS — F3341 Major depressive disorder, recurrent, in partial remission: Secondary | ICD-10-CM | POA: Diagnosis not present

## 2020-07-21 DIAGNOSIS — F411 Generalized anxiety disorder: Secondary | ICD-10-CM | POA: Diagnosis not present

## 2020-07-21 DIAGNOSIS — M2669 Other specified disorders of temporomandibular joint: Secondary | ICD-10-CM | POA: Diagnosis not present

## 2020-07-21 DIAGNOSIS — H938X2 Other specified disorders of left ear: Secondary | ICD-10-CM | POA: Diagnosis not present

## 2020-08-08 ENCOUNTER — Encounter: Payer: Self-pay | Admitting: Obstetrics and Gynecology

## 2020-08-08 ENCOUNTER — Other Ambulatory Visit: Payer: Self-pay

## 2020-08-08 DIAGNOSIS — Z3041 Encounter for surveillance of contraceptive pills: Secondary | ICD-10-CM

## 2020-08-08 MED ORDER — NORETHINDRONE 0.35 MG PO TABS: 1.0000 | ORAL_TABLET | Freq: Every day | ORAL | 0 refills | Status: DC

## 2020-08-11 DIAGNOSIS — F3341 Major depressive disorder, recurrent, in partial remission: Secondary | ICD-10-CM | POA: Diagnosis not present

## 2020-08-11 DIAGNOSIS — F900 Attention-deficit hyperactivity disorder, predominantly inattentive type: Secondary | ICD-10-CM | POA: Diagnosis not present

## 2020-08-11 DIAGNOSIS — F411 Generalized anxiety disorder: Secondary | ICD-10-CM | POA: Diagnosis not present

## 2020-08-25 DIAGNOSIS — F3341 Major depressive disorder, recurrent, in partial remission: Secondary | ICD-10-CM | POA: Diagnosis not present

## 2020-08-25 DIAGNOSIS — F900 Attention-deficit hyperactivity disorder, predominantly inattentive type: Secondary | ICD-10-CM | POA: Diagnosis not present

## 2020-08-25 DIAGNOSIS — F411 Generalized anxiety disorder: Secondary | ICD-10-CM | POA: Diagnosis not present

## 2020-08-29 ENCOUNTER — Ambulatory Visit: Payer: 59 | Admitting: Obstetrics and Gynecology

## 2020-08-29 ENCOUNTER — Ambulatory Visit (INDEPENDENT_AMBULATORY_CARE_PROVIDER_SITE_OTHER): Payer: BC Managed Care – PPO | Admitting: Obstetrics and Gynecology

## 2020-08-29 ENCOUNTER — Other Ambulatory Visit: Payer: Self-pay

## 2020-08-29 ENCOUNTER — Encounter: Payer: Self-pay | Admitting: Obstetrics and Gynecology

## 2020-08-29 VITALS — BP 104/70 | Ht 64.0 in | Wt 153.0 lb

## 2020-08-29 DIAGNOSIS — Z3041 Encounter for surveillance of contraceptive pills: Secondary | ICD-10-CM | POA: Diagnosis not present

## 2020-08-29 DIAGNOSIS — Z01419 Encounter for gynecological examination (general) (routine) without abnormal findings: Secondary | ICD-10-CM

## 2020-08-29 DIAGNOSIS — N921 Excessive and frequent menstruation with irregular cycle: Secondary | ICD-10-CM

## 2020-08-29 DIAGNOSIS — N94819 Vulvodynia, unspecified: Secondary | ICD-10-CM | POA: Diagnosis not present

## 2020-08-29 MED ORDER — SLYND 4 MG PO TABS: 1.0000 | ORAL_TABLET | Freq: Every day | ORAL | 1 refills | Status: DC

## 2020-08-29 NOTE — Patient Instructions (Signed)
I value your feedback and you entrusting us with your care. If you get a [facility] patient survey, I would appreciate you taking the time to let us know about your experience today. Thank you! ? ? ?

## 2020-08-29 NOTE — Progress Notes (Signed)
PCP:  Myrlene Broker, MD   Chief Complaint  Patient presents with   Gynecologic Exam    No concerns     HPI:      Ms. Carrie Kennedy is a 35 y.o. G0P0000 who LMP was Patient's last menstrual period was 07/17/2020 (approximate)., presents today for her annual examination.  Her menses are irregular on POPs, lasting 5-7 days, mod to light flow.  Dysmenorrhea mild, occurring first 1-2 days of flow. She has occas intermenstrual bleeding.   Sex activity: currently sexually active--contraception: POPs. 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 May want to conceive in near future.  Last Pap: 06/05/17  Results were: no abnormalities /neg HPV DNA  Hx of STDs: none  There is a FH of breast cancer in her MGM, genetic testing not indicated. There is a FH of ovarian cancer in her mat 2nd cousin. The patient does do self-breast exams.  Tobacco use: The patient denies current or previous tobacco use. Alcohol use: social drinker No drug use.  Exercise: moderately active  She does get adequate calcium and Vitamin D in her diet.  Pt on cymbalta for vulvodynia, followed by MD at [facility]. Also has estrogen/lidocaine crm prn. Hx of BV in past, doing well with probiotics.   Also on propranolol for HTN, followed by PCP. Has anxiety/depression. Sees therapist, on cymbalta.  Past Medical History:  Diagnosis Date   Dysmenorrhea    Hyperlipidemia    Hypertension    Tachycardia    Vulvodynia     Past Surgical History:  Procedure Laterality Date   EAR      TUBES    Family History  Problem Relation Age of Onset   Hypertension Mother    Heart disease Mother    Prostate cancer Father    Hypertension Father    Breast cancer Maternal Grandmother 53    Social History   Socioeconomic History   Marital status: Single    Spouse name: Not on file   Number of children: Not on file   Years of education: Not on file   Highest education level: Not on file  Occupational History   Not  on file  Tobacco Use   Smoking status: Never   Smokeless tobacco: Never  Vaping Use   Vaping Use: Never used  Substance and Sexual Activity   Alcohol use: Yes   Drug use: No   Sexual activity: Yes    Birth control/protection: Pill  Other Topics Concern   Not on file  Social History Narrative   Not on file   Social Determinants of Health   Financial Resource Strain: Not on file  Food Insecurity: Not on file  Transportation Needs: Not on file  Physical Activity: Not on file  Stress: Not on file  Social Connections: Not on file  Intimate Partner Violence: Not on file    Outpatient Medications Prior to Visit  Medication Sig Dispense Refill   diazepam (VALIUM) 5 MG tablet      DULoxetine (CYMBALTA) 60 MG capsule Take 60 mg by mouth daily.     fluticasone (FLONASE) 50 MCG/ACT nasal spray Place 2 sprays into both nostrils in the morning and at bedtime.     Multiple Vitamins-Iron (MULTIVITAMINS WITH IRON) TABS tablet      norethindrone (MICRONOR) 0.35 MG tablet Take 1 tablet (0.35 mg total) by mouth daily. 84 tablet 0   Probiotic Product (PROBIOTIC DAILY) CAPS Take 1 capsule by mouth daily.  propranolol (INDERAL) 20 MG tablet Take 20 mg by mouth 2 (two) times daily.     UNABLE TO FIND Take 2 capsules by mouth daily. Med Name: Umzu Gut Support     azelastine (ASTELIN) 0.1 % nasal spray Place into the nose.     Multiple Vitamins-Minerals (MULTIVITAMIN GUMMIES WOMENS) CHEW      UNABLE TO FIND Take by mouth daily. Med Name: Judithann Sheen Complexion Gummies. 2 gummies     UNABLE TO FIND Take by mouth daily. Med Name: Amanda Pea Beauty Gummies. 2 gummies     No facility-administered medications prior to visit.      ROS:  Review of Systems  Constitutional:  Negative for fatigue, fever and unexpected weight change.  Respiratory:  Negative for cough, shortness of breath and wheezing.   Cardiovascular:  Negative for chest pain, palpitations and leg swelling.   Gastrointestinal:  Negative for blood in stool, constipation, diarrhea, nausea and vomiting.  Endocrine: Negative for cold intolerance, heat intolerance and polyuria.  Genitourinary:  Positive for vaginal bleeding. Negative for dyspareunia, dysuria, flank pain, frequency, genital sores, hematuria, menstrual problem, pelvic pain, urgency, vaginal discharge and vaginal pain.  Musculoskeletal:  Negative for back pain, joint swelling and myalgias.  Skin:  Negative for rash.  Neurological:  Negative for dizziness, syncope, light-headedness, numbness and headaches.  Hematological:  Negative for adenopathy.  Psychiatric/Behavioral:  Negative for agitation, confusion, dysphoric mood, sleep disturbance and suicidal ideas. The patient is not nervous/anxious.  BREAST: No symptoms   Objective: BP 104/70   Ht 5\' 4"  (1.626 m)   Wt 153 lb (69.4 kg)   LMP 07/17/2020 (Approximate)   BMI 26.26 kg/m    Physical Exam Constitutional:      Appearance: She is well-developed.  Genitourinary:     Vulva normal.     Right Labia: No rash, tenderness or lesions.    Left Labia: No tenderness, lesions or rash.    No vaginal discharge, erythema or tenderness.      Right Adnexa: not tender and no mass present.    Left Adnexa: not tender and no mass present.    No cervical motion tenderness, friability or polyp.     Uterus is not enlarged or tender.  Breasts:    Right: No mass, nipple discharge, skin change or tenderness.     Left: No mass, nipple discharge, skin change or tenderness.  Neck:     Thyroid: No thyromegaly.  Cardiovascular:     Rate and Rhythm: Normal rate and regular rhythm.     Heart sounds: Normal heart sounds. No murmur heard. Pulmonary:     Effort: Pulmonary effort is normal.     Breath sounds: Normal breath sounds.  Abdominal:     Palpations: Abdomen is soft.     Tenderness: There is no abdominal tenderness. There is no guarding or rebound.  Musculoskeletal:        General: Normal  range of motion.     Cervical back: Normal range of motion.  Lymphadenopathy:     Cervical: No cervical adenopathy.  Neurological:     General: No focal deficit present.     Mental Status: She is alert and oriented to person, place, and time.     Cranial Nerves: No cranial nerve deficit.  Skin:    General: Skin is warm and dry.  Psychiatric:        Mood and Affect: Mood normal.        Behavior: Behavior normal.  Thought Content: Thought content normal.        Judgment: Judgment normal.  Vitals reviewed.    Assessment/Plan: Encounter for annual routine gynecological examination  Encounter for surveillance of contraceptive pills - Plan: Drospirenone (SLYND) 4 MG TABS; pt can only do prog only options due to HTN. Will try slynd due to BTB/irreg menses with camila. 2 samples/coupon card given. Will f/u with sx. If does well, will send in Rx. If not, will resume camila.   Breakthrough bleeding on OCPs - Plan: Drospirenone (SLYND) 4 MG TABS  Vulvodynia--followed at [facility].   Meds ordered this encounter  Medications   Drospirenone (SLYND) 4 MG TABS    Sig: Take 1 tablet by mouth daily. 2 SAMPLES GIVEN    Dispense:  28 tablet    Refill:  1    Order Specific Question:   Supervising Provider    Answer:   Nadara Mustard [[id number]]     GYN counsel adequate intake of calcium and vitamin D, diet and exercise     F/U  Return in about 1 year (around 08/29/2021).  Dannell Raczkowski B. Unnamed Hino, PA-C 08/29/2020 2:58 PM

## 2020-09-08 DIAGNOSIS — F411 Generalized anxiety disorder: Secondary | ICD-10-CM | POA: Diagnosis not present

## 2020-09-08 DIAGNOSIS — F900 Attention-deficit hyperactivity disorder, predominantly inattentive type: Secondary | ICD-10-CM | POA: Diagnosis not present

## 2020-09-08 DIAGNOSIS — F3341 Major depressive disorder, recurrent, in partial remission: Secondary | ICD-10-CM | POA: Diagnosis not present

## 2020-09-22 DIAGNOSIS — F411 Generalized anxiety disorder: Secondary | ICD-10-CM | POA: Diagnosis not present

## 2020-09-22 DIAGNOSIS — F3341 Major depressive disorder, recurrent, in partial remission: Secondary | ICD-10-CM | POA: Diagnosis not present

## 2020-09-22 DIAGNOSIS — F900 Attention-deficit hyperactivity disorder, predominantly inattentive type: Secondary | ICD-10-CM | POA: Diagnosis not present

## 2020-09-25 ENCOUNTER — Encounter: Payer: Self-pay | Admitting: Obstetrics and Gynecology

## 2020-10-06 DIAGNOSIS — F411 Generalized anxiety disorder: Secondary | ICD-10-CM | POA: Diagnosis not present

## 2020-10-06 DIAGNOSIS — F3341 Major depressive disorder, recurrent, in partial remission: Secondary | ICD-10-CM | POA: Diagnosis not present

## 2020-10-06 DIAGNOSIS — F900 Attention-deficit hyperactivity disorder, predominantly inattentive type: Secondary | ICD-10-CM | POA: Diagnosis not present

## 2020-10-27 DIAGNOSIS — F411 Generalized anxiety disorder: Secondary | ICD-10-CM | POA: Diagnosis not present

## 2020-10-27 DIAGNOSIS — F3341 Major depressive disorder, recurrent, in partial remission: Secondary | ICD-10-CM | POA: Diagnosis not present

## 2020-10-27 DIAGNOSIS — F900 Attention-deficit hyperactivity disorder, predominantly inattentive type: Secondary | ICD-10-CM | POA: Diagnosis not present

## 2020-11-09 DIAGNOSIS — N9489 Other specified conditions associated with female genital organs and menstrual cycle: Secondary | ICD-10-CM | POA: Diagnosis not present

## 2020-11-09 DIAGNOSIS — N94819 Vulvodynia, unspecified: Secondary | ICD-10-CM | POA: Diagnosis not present

## 2020-11-09 DIAGNOSIS — G8929 Other chronic pain: Secondary | ICD-10-CM | POA: Diagnosis not present

## 2020-11-17 DIAGNOSIS — F3341 Major depressive disorder, recurrent, in partial remission: Secondary | ICD-10-CM | POA: Diagnosis not present

## 2020-11-17 DIAGNOSIS — F900 Attention-deficit hyperactivity disorder, predominantly inattentive type: Secondary | ICD-10-CM | POA: Diagnosis not present

## 2020-11-17 DIAGNOSIS — F411 Generalized anxiety disorder: Secondary | ICD-10-CM | POA: Diagnosis not present

## 2020-12-08 DIAGNOSIS — F900 Attention-deficit hyperactivity disorder, predominantly inattentive type: Secondary | ICD-10-CM | POA: Diagnosis not present

## 2020-12-08 DIAGNOSIS — F411 Generalized anxiety disorder: Secondary | ICD-10-CM | POA: Diagnosis not present

## 2020-12-22 DIAGNOSIS — F411 Generalized anxiety disorder: Secondary | ICD-10-CM | POA: Diagnosis not present

## 2020-12-22 DIAGNOSIS — F900 Attention-deficit hyperactivity disorder, predominantly inattentive type: Secondary | ICD-10-CM | POA: Diagnosis not present

## 2020-12-22 DIAGNOSIS — F3341 Major depressive disorder, recurrent, in partial remission: Secondary | ICD-10-CM | POA: Diagnosis not present

## 2021-01-02 ENCOUNTER — Other Ambulatory Visit: Payer: Self-pay

## 2021-01-02 ENCOUNTER — Ambulatory Visit (INDEPENDENT_AMBULATORY_CARE_PROVIDER_SITE_OTHER): Payer: BC Managed Care – PPO | Admitting: Obstetrics and Gynecology

## 2021-01-02 ENCOUNTER — Encounter: Payer: Self-pay | Admitting: Obstetrics and Gynecology

## 2021-01-02 VITALS — BP 110/80 | Ht 64.0 in | Wt 157.0 lb

## 2021-01-02 DIAGNOSIS — N898 Other specified noninflammatory disorders of vagina: Secondary | ICD-10-CM

## 2021-01-02 DIAGNOSIS — N94819 Vulvodynia, unspecified: Secondary | ICD-10-CM

## 2021-01-02 DIAGNOSIS — R399 Unspecified symptoms and signs involving the genitourinary system: Secondary | ICD-10-CM | POA: Diagnosis not present

## 2021-01-02 LAB — POCT URINALYSIS DIPSTICK
Bilirubin, UA: NEGATIVE
Blood, UA: NEGATIVE
Glucose, UA: NEGATIVE
Ketones, UA: NEGATIVE
Leukocytes, UA: NEGATIVE
Nitrite, UA: NEGATIVE
Protein, UA: NEGATIVE
Spec Grav, UA: 1.01 (ref 1.010–1.025)
pH, UA: 6 (ref 5.0–8.0)

## 2021-01-02 LAB — POCT WET PREP WITH KOH
Clue Cells Wet Prep HPF POC: NEGATIVE
KOH Prep POC: NEGATIVE
Trichomonas, UA: NEGATIVE
Yeast Wet Prep HPF POC: NEGATIVE

## 2021-01-02 MED ORDER — FLUCONAZOLE 150 MG PO TABS: 150.0000 mg | ORAL_TABLET | Freq: Once | ORAL | 0 refills | Status: AC

## 2021-01-02 NOTE — Progress Notes (Signed)
Hoyt Koch, MD   Chief Complaint  Patient presents with   Urinary Tract Infection    Frequency and burning urinating   Vaginal Pain    Pain during intercourse and bled after intercourse    HPI:      Ms. Carrie Kennedy is a 35 y.o. G0P0000 whose LMP was Patient's last menstrual period was 12/14/2020 (approximate)., presents today for increased vag d/c with irritation for the past wk. No fishy odor, hx of yeast/BV in past. No prior abx use, no meds to treat. Has had urinary frequency/urgency and dysuria for the past 2 days, with some pelvic discomfort. No hematuria/fevers. Drinks caffeine daily. Also had pain with sex and a little bleeding/tear after sex last time about a wk ago. Pain has persisted. Hx of vulvodynia so used to pain, but not improving. Pt called [facility] vulvodynia clinic and they can't see her for a month. Typical topical meds of estrogen, lidocaine and vaginal valium not helping.  No new partners.    Patient Active Problem List   Diagnosis Date Noted   Depression 04/21/2020   Allergic rhinitis 04/21/2020   Routine general medical examination at a health care facility 04/21/2020   Vitamin D deficiency 04/21/2020   Tachycardia    Dysmenorrhea    Hypertension 08/26/2016    Past Surgical History:  Procedure Laterality Date   EAR      TUBES    Family History  Problem Relation Age of Onset   Hypertension Mother    Heart disease Mother    Prostate cancer Father    Hypertension Father    Breast cancer Maternal Grandmother 33    Social History   Socioeconomic History   Marital status: Single    Spouse name: Not on file   Number of children: Not on file   Years of education: Not on file   Highest education level: Not on file  Occupational History   Not on file  Tobacco Use   Smoking status: Never   Smokeless tobacco: Never  Vaping Use   Vaping Use: Never used  Substance and Sexual Activity   Alcohol use: Yes   Drug use: No   Sexual  activity: Yes    Birth control/protection: Pill  Other Topics Concern   Not on file  Social History Narrative   Not on file   Social Determinants of Health   Financial Resource Strain: Not on file  Food Insecurity: Not on file  Transportation Needs: Not on file  Physical Activity: Not on file  Stress: Not on file  Social Connections: Not on file  Intimate Partner Violence: Not on file    Outpatient Medications Prior to Visit  Medication Sig Dispense Refill   diazepam (VALIUM) 5 MG tablet      DULoxetine (CYMBALTA) 60 MG capsule Take 60 mg by mouth daily.     fluticasone (FLONASE) 50 MCG/ACT nasal spray Place 2 sprays into both nostrils in the morning and at bedtime.     Multiple Vitamins-Iron (MULTIVITAMINS WITH IRON) TABS tablet      Probiotic Product (PROBIOTIC DAILY) CAPS Take 1 capsule by mouth daily.     propranolol (INDERAL) 20 MG tablet Take 20 mg by mouth 2 (two) times daily.     UNABLE TO FIND Take 2 capsules by mouth daily. Med Name: Umzu Gut Support     azelastine (ASTELIN) 0.1 % nasal spray Place into the nose.     Drospirenone (SLYND) 4 MG TABS Take  1 tablet by mouth daily. 2 SAMPLES GIVEN 28 tablet 1   norethindrone (MICRONOR) 0.35 MG tablet Take 1 tablet (0.35 mg total) by mouth daily. 84 tablet 0   No facility-administered medications prior to visit.      ROS:  Review of Systems  Constitutional:  Negative for fever.  Gastrointestinal:  Negative for blood in stool, constipation, diarrhea, nausea and vomiting.  Genitourinary:  Positive for dyspareunia, dysuria, frequency, urgency, vaginal discharge and vaginal pain. Negative for flank pain, hematuria and vaginal bleeding.  Musculoskeletal:  Negative for back pain.  Skin:  Negative for rash.  BREAST: No symptoms   OBJECTIVE:   Vitals:  BP 110/80   Ht 5\' 4"  (1.626 m)   Wt 157 lb (71.2 kg)   LMP 12/14/2020 (Approximate)   BMI 26.95 kg/m   Physical Exam Vitals reviewed.  Constitutional:       Appearance: She is well-developed.  Pulmonary:     Effort: Pulmonary effort is normal.  Genitourinary:    General: Normal vulva.     Pubic Area: No rash.      Labia:        Right: No rash, tenderness or lesion.        Left: No rash, tenderness or lesion.      Vagina: Vaginal discharge present. No erythema or tenderness.     Cervix: Normal.     Uterus: Normal. Not enlarged and not tender.      Adnexa: Right adnexa normal and left adnexa normal.       Right: No mass or tenderness.         Left: No mass or tenderness.    Musculoskeletal:        General: Normal range of motion.     Cervical back: Normal range of motion.  Skin:    General: Skin is warm and dry.  Neurological:     General: No focal deficit present.     Mental Status: She is alert and oriented to person, place, and time.  Psychiatric:        Mood and Affect: Mood normal.        Behavior: Behavior normal.        Thought Content: Thought content normal.        Judgment: Judgment normal.    Results: Results for orders placed or performed in visit on 01/02/21 (from the past 24 hour(s))  POCT Wet Prep with KOH     Status: Normal   Collection Time: 01/02/21  4:01 PM  Result Value Ref Range   Trichomonas, UA Negative    Clue Cells Wet Prep HPF POC neg    Epithelial Wet Prep HPF POC     Yeast Wet Prep HPF POC neg    Bacteria Wet Prep HPF POC     RBC Wet Prep HPF POC     WBC Wet Prep HPF POC     KOH Prep POC Negative Negative  POCT Urinalysis Dipstick     Status: Normal   Collection Time: 01/02/21  4:01 PM  Result Value Ref Range   Color, UA yellow    Clarity, UA clear    Glucose, UA Negative Negative   Bilirubin, UA neg    Ketones, UA neg    Spec Grav, UA 1.010 1.010 - 1.025   Blood, UA neg    pH, UA 6.0 5.0 - 8.0   Protein, UA Negative Negative   Urobilinogen, UA     Nitrite, UA neg  Leukocytes, UA Negative Negative   Appearance     Odor       Assessment/Plan: Vaginal irritation - Plan: fluconazole  (DIFLUCAN) 150 MG tablet, POCT Wet Prep with KOH, NuSwab Vaginitis (VG); neg wet prep, pos d/c on exam, no erythema. Treat empirically for yeast with diflucan. Check vaginitis culture. Will f/u if abn.   UTI symptoms - Plan: POCT Urinalysis Dipstick, Urine Culture; neg UA, check C&S. Will f/u if pos. D/c caffeine, increase water in meantime.   Vulvodynia--sx flare. Question yeast vag causing urinary and vaginal sx. Treat for yeast, keep [facility] appt in 1 mo in case sx persist/worsen.    Meds ordered this encounter  Medications   fluconazole (DIFLUCAN) 150 MG tablet    Sig: Take 1 tablet (150 mg total) by mouth once for 1 dose. May repeat in 3 days if still having symptoms    Dispense:  2 tablet    Refill:  0    Order Specific Question:   Supervising Provider    Answer:   Nadara Mustard [[id number]]      Return if symptoms worsen or fail to improve.  Breezie Micucci B. Amia Rynders, PA-C 01/02/2021 4:03 PM

## 2021-01-04 ENCOUNTER — Encounter: Payer: Self-pay | Admitting: Obstetrics and Gynecology

## 2021-01-04 LAB — URINE CULTURE: Organism ID, Bacteria: NO GROWTH

## 2021-01-04 LAB — NUSWAB VAGINITIS (VG)
Candida albicans, NAA: NEGATIVE
Candida glabrata, NAA: NEGATIVE
Trich vag by NAA: NEGATIVE

## 2021-01-12 DIAGNOSIS — F411 Generalized anxiety disorder: Secondary | ICD-10-CM | POA: Diagnosis not present

## 2021-01-12 DIAGNOSIS — F3341 Major depressive disorder, recurrent, in partial remission: Secondary | ICD-10-CM | POA: Diagnosis not present

## 2021-01-12 DIAGNOSIS — F900 Attention-deficit hyperactivity disorder, predominantly inattentive type: Secondary | ICD-10-CM | POA: Diagnosis not present

## 2021-01-15 DIAGNOSIS — N898 Other specified noninflammatory disorders of vagina: Secondary | ICD-10-CM | POA: Diagnosis not present

## 2021-01-15 DIAGNOSIS — N94819 Vulvodynia, unspecified: Secondary | ICD-10-CM | POA: Diagnosis not present

## 2021-01-29 DIAGNOSIS — Z03818 Encounter for observation for suspected exposure to other biological agents ruled out: Secondary | ICD-10-CM | POA: Diagnosis not present

## 2021-01-29 DIAGNOSIS — Z20822 Contact with and (suspected) exposure to covid-19: Secondary | ICD-10-CM | POA: Diagnosis not present

## 2021-01-29 DIAGNOSIS — R509 Fever, unspecified: Secondary | ICD-10-CM | POA: Diagnosis not present

## 2021-01-29 DIAGNOSIS — J0100 Acute maxillary sinusitis, unspecified: Secondary | ICD-10-CM | POA: Diagnosis not present

## 2021-02-02 DIAGNOSIS — F411 Generalized anxiety disorder: Secondary | ICD-10-CM | POA: Diagnosis not present

## 2021-02-02 DIAGNOSIS — F3341 Major depressive disorder, recurrent, in partial remission: Secondary | ICD-10-CM | POA: Diagnosis not present

## 2021-02-02 DIAGNOSIS — F900 Attention-deficit hyperactivity disorder, predominantly inattentive type: Secondary | ICD-10-CM | POA: Diagnosis not present

## 2021-02-22 DIAGNOSIS — Z30430 Encounter for insertion of intrauterine contraceptive device: Secondary | ICD-10-CM | POA: Diagnosis not present

## 2021-02-22 DIAGNOSIS — N94819 Vulvodynia, unspecified: Secondary | ICD-10-CM | POA: Diagnosis not present

## 2021-02-22 DIAGNOSIS — G8929 Other chronic pain: Secondary | ICD-10-CM | POA: Diagnosis not present

## 2021-02-22 DIAGNOSIS — N9489 Other specified conditions associated with female genital organs and menstrual cycle: Secondary | ICD-10-CM | POA: Diagnosis not present

## 2021-02-23 DIAGNOSIS — F411 Generalized anxiety disorder: Secondary | ICD-10-CM | POA: Diagnosis not present

## 2021-02-23 DIAGNOSIS — F3342 Major depressive disorder, recurrent, in full remission: Secondary | ICD-10-CM | POA: Diagnosis not present

## 2021-02-23 DIAGNOSIS — F900 Attention-deficit hyperactivity disorder, predominantly inattentive type: Secondary | ICD-10-CM | POA: Diagnosis not present

## 2021-04-20 DIAGNOSIS — F411 Generalized anxiety disorder: Secondary | ICD-10-CM | POA: Diagnosis not present

## 2021-04-20 DIAGNOSIS — F900 Attention-deficit hyperactivity disorder, predominantly inattentive type: Secondary | ICD-10-CM | POA: Diagnosis not present

## 2021-06-08 DIAGNOSIS — F411 Generalized anxiety disorder: Secondary | ICD-10-CM | POA: Diagnosis not present

## 2021-06-08 DIAGNOSIS — F900 Attention-deficit hyperactivity disorder, predominantly inattentive type: Secondary | ICD-10-CM | POA: Diagnosis not present

## 2021-07-13 DIAGNOSIS — F900 Attention-deficit hyperactivity disorder, predominantly inattentive type: Secondary | ICD-10-CM | POA: Diagnosis not present

## 2021-07-13 DIAGNOSIS — F3342 Major depressive disorder, recurrent, in full remission: Secondary | ICD-10-CM | POA: Diagnosis not present

## 2021-07-13 DIAGNOSIS — F411 Generalized anxiety disorder: Secondary | ICD-10-CM | POA: Diagnosis not present

## 2021-08-17 DIAGNOSIS — F900 Attention-deficit hyperactivity disorder, predominantly inattentive type: Secondary | ICD-10-CM | POA: Diagnosis not present

## 2021-08-17 DIAGNOSIS — F411 Generalized anxiety disorder: Secondary | ICD-10-CM | POA: Diagnosis not present

## 2021-08-17 DIAGNOSIS — F3342 Major depressive disorder, recurrent, in full remission: Secondary | ICD-10-CM | POA: Diagnosis not present

## 2021-08-22 ENCOUNTER — Encounter: Payer: BC Managed Care – PPO | Admitting: Internal Medicine

## 2021-08-24 ENCOUNTER — Ambulatory Visit (INDEPENDENT_AMBULATORY_CARE_PROVIDER_SITE_OTHER): Payer: BC Managed Care – PPO | Admitting: Internal Medicine

## 2021-08-24 ENCOUNTER — Encounter: Payer: Self-pay | Admitting: Internal Medicine

## 2021-08-24 VITALS — BP 122/74 | HR 78 | Resp 18 | Ht 64.0 in | Wt 170.4 lb

## 2021-08-24 DIAGNOSIS — Z8349 Family history of other endocrine, nutritional and metabolic diseases: Secondary | ICD-10-CM | POA: Diagnosis not present

## 2021-08-24 DIAGNOSIS — E669 Obesity, unspecified: Secondary | ICD-10-CM | POA: Insufficient documentation

## 2021-08-24 DIAGNOSIS — I10 Essential (primary) hypertension: Secondary | ICD-10-CM

## 2021-08-24 DIAGNOSIS — Z0000 Encounter for general adult medical examination without abnormal findings: Secondary | ICD-10-CM | POA: Diagnosis not present

## 2021-08-24 DIAGNOSIS — F325 Major depressive disorder, single episode, in full remission: Secondary | ICD-10-CM | POA: Diagnosis not present

## 2021-08-24 DIAGNOSIS — E559 Vitamin D deficiency, unspecified: Secondary | ICD-10-CM

## 2021-08-24 DIAGNOSIS — E663 Overweight: Secondary | ICD-10-CM

## 2021-08-24 LAB — COMPREHENSIVE METABOLIC PANEL
ALT: 15 U/L (ref 0–35)
AST: 17 U/L (ref 0–37)
Albumin: 4.7 g/dL (ref 3.5–5.2)
Alkaline Phosphatase: 65 U/L (ref 39–117)
BUN: 15 mg/dL (ref 6–23)
CO2: 29 mEq/L (ref 19–32)
Calcium: 9.7 mg/dL (ref 8.4–10.5)
Chloride: 106 mEq/L (ref 96–112)
Creatinine, Ser: 0.69 mg/dL (ref 0.40–1.20)
GFR: 110.98 mL/min (ref >60.00)
Glucose, Bld: 88 mg/dL (ref 70–99)
Potassium: 4.6 mEq/L (ref 3.5–5.1)
Sodium: 143 mEq/L (ref 135–145)
Total Bilirubin: 0.5 mg/dL (ref 0.2–1.2)
Total Protein: 7.7 g/dL (ref 6.0–8.3)

## 2021-08-24 LAB — CBC
HCT: 41.5 % (ref 36.0–46.0)
Hemoglobin: 13.7 g/dL (ref 12.0–15.0)
MCHC: 33.1 g/dL (ref 30.0–36.0)
MCV: 85.8 fl (ref 78.0–100.0)
Platelets: 400 10*3/uL (ref 150.0–400.0)
RBC: 4.83 Mil/uL (ref 3.87–5.11)
RDW: 13.6 % (ref 11.5–15.5)
WBC: 5.9 10*3/uL (ref 4.0–10.5)

## 2021-08-24 LAB — LIPID PANEL
Cholesterol: 193 mg/dL (ref 0–200)
HDL: 56.2 mg/dL (ref >39.00)
LDL Cholesterol: 128 mg/dL — ABNORMAL HIGH (ref 0–99)
NonHDL: 137.1
Total CHOL/HDL Ratio: 3
Triglycerides: 48 mg/dL (ref 0.0–149.0)
VLDL: 9.6 mg/dL (ref 0.0–40.0)

## 2021-08-24 LAB — CORTISOL: Cortisol, Plasma: 5.3 ug/dL

## 2021-08-24 LAB — T4, FREE: Free T4: 0.68 ng/dL (ref 0.60–1.60)

## 2021-08-24 LAB — VITAMIN D 25 HYDROXY (VIT D DEFICIENCY, FRACTURES): VITD: 35.19 ng/mL (ref 30.00–100.00)

## 2021-08-24 LAB — TSH: TSH: 2.28 u[IU]/mL (ref 0.35–5.50)

## 2021-08-24 LAB — HEMOGLOBIN A1C: Hgb A1c MFr Bld: 5.3 % (ref 4.6–6.5)

## 2021-08-24 LAB — VITAMIN B12: Vitamin B-12: 475 pg/mL (ref 211–911)

## 2021-08-24 MED ORDER — NALTREXONE HCL 50 MG PO TABS: 50.0000 mg | ORAL_TABLET | Freq: Every day | ORAL | 6 refills | Status: DC

## 2021-08-24 NOTE — Assessment & Plan Note (Signed)
Checking TSH and free T4 today to assess for thyroid because she is having worsening fatigue.

## 2021-08-24 NOTE — Assessment & Plan Note (Signed)
Checking vitamin D level and adjust as needed. Using sunlight and otc vitamin D currently.

## 2021-08-24 NOTE — Patient Instructions (Addendum)
We will check the labs to help rule some things out.  We have sent in the naltrexone to take 1 pill daily to see if this can help with food cravings.

## 2021-08-24 NOTE — Progress Notes (Signed)
   Subjective:   Patient ID: Carrie Kennedy, female    DOB: [birth year], 36 y.o.   MRN: [id number]  HPI The patient is here for physical with concerns as well.  PMH, [facility], social history reviewed and updated  Review of Systems  Constitutional: Negative.   HENT: Negative.    Eyes: Negative.   Respiratory:  Negative for cough, chest tightness and shortness of breath.   Cardiovascular:  Negative for chest pain, palpitations and leg swelling.  Gastrointestinal:  Negative for abdominal distention, abdominal pain, constipation, diarrhea, nausea and vomiting.  Musculoskeletal: Negative.   Skin: Negative.   Neurological: Negative.   Psychiatric/Behavioral: Negative.      Objective:  Physical Exam Constitutional:      Appearance: She is well-developed.  HENT:     Head: Normocephalic and atraumatic.  Cardiovascular:     Rate and Rhythm: Normal rate and regular rhythm.  Pulmonary:     Effort: Pulmonary effort is normal. No respiratory distress.     Breath sounds: Normal breath sounds. No wheezing or rales.  Abdominal:     General: Bowel sounds are normal. There is no distension.     Palpations: Abdomen is soft.     Tenderness: There is no abdominal tenderness. There is no rebound.  Musculoskeletal:     Cervical back: Normal range of motion.  Skin:    General: Skin is warm and dry.  Neurological:     Mental Status: She is alert and oriented to person, place, and time.     Coordination: Coordination normal.     Vitals:   08/24/21 0814  BP: 122/74  Pulse: 78  Resp: 18  SpO2: 99%  Weight: 170 lb 6.4 oz (77.3 kg)  Height: 5\' 4"  (1.626 m)   Assessment & Plan:

## 2021-08-24 NOTE — Assessment & Plan Note (Signed)
Symptoms are well controlled on cymbalta 60 mg daily. Checking HgA1c and CBC and CMP to rule out metabolic side effects as well as lipid panel.

## 2021-08-24 NOTE — Assessment & Plan Note (Addendum)
BP at goal on propranolol 20 mg BID and we talked about how this could be contributing to lack of energy and fatigue feeling. Checking CMP and CBC and AM cortisol.

## 2021-08-24 NOTE — Assessment & Plan Note (Signed)
Flu shot yearly. Covid-19 counseled. Tetanus up to date. Pap smear up to date. Counseled about sun safety and mole surveillance. Counseled about the dangers of distracted driving. Given 10 year screening recommendations.   

## 2021-08-24 NOTE — Assessment & Plan Note (Signed)
We had a long discussion about weight and her BMI and medications which may be options (although not likely covered since her BMI is <30. Offered naltrexone 50 mg daily to add since she is already on cymbalta for mood which is well controlling her mood.

## 2021-08-31 ENCOUNTER — Encounter: Payer: Self-pay | Admitting: Internal Medicine

## 2021-09-21 DIAGNOSIS — F411 Generalized anxiety disorder: Secondary | ICD-10-CM | POA: Diagnosis not present

## 2021-09-21 DIAGNOSIS — F900 Attention-deficit hyperactivity disorder, predominantly inattentive type: Secondary | ICD-10-CM | POA: Diagnosis not present

## 2021-09-21 DIAGNOSIS — F3342 Major depressive disorder, recurrent, in full remission: Secondary | ICD-10-CM | POA: Diagnosis not present

## 2021-10-19 DIAGNOSIS — F411 Generalized anxiety disorder: Secondary | ICD-10-CM | POA: Diagnosis not present

## 2021-10-19 DIAGNOSIS — F900 Attention-deficit hyperactivity disorder, predominantly inattentive type: Secondary | ICD-10-CM | POA: Diagnosis not present

## 2021-10-19 DIAGNOSIS — F3342 Major depressive disorder, recurrent, in full remission: Secondary | ICD-10-CM | POA: Diagnosis not present

## 2021-11-23 DIAGNOSIS — F900 Attention-deficit hyperactivity disorder, predominantly inattentive type: Secondary | ICD-10-CM | POA: Diagnosis not present

## 2021-11-23 DIAGNOSIS — F411 Generalized anxiety disorder: Secondary | ICD-10-CM | POA: Diagnosis not present

## 2021-12-07 DIAGNOSIS — J343 Hypertrophy of nasal turbinates: Secondary | ICD-10-CM | POA: Diagnosis not present

## 2021-12-07 DIAGNOSIS — J301 Allergic rhinitis due to pollen: Secondary | ICD-10-CM | POA: Diagnosis not present

## 2021-12-07 DIAGNOSIS — H7403 Tympanosclerosis, bilateral: Secondary | ICD-10-CM | POA: Diagnosis not present

## 2021-12-11 ENCOUNTER — Encounter: Payer: Self-pay | Admitting: Internal Medicine

## 2022-01-04 DIAGNOSIS — F900 Attention-deficit hyperactivity disorder, predominantly inattentive type: Secondary | ICD-10-CM | POA: Diagnosis not present

## 2022-01-04 DIAGNOSIS — F411 Generalized anxiety disorder: Secondary | ICD-10-CM | POA: Diagnosis not present

## 2022-01-05 DIAGNOSIS — J029 Acute pharyngitis, unspecified: Secondary | ICD-10-CM | POA: Diagnosis not present

## 2022-01-05 DIAGNOSIS — J309 Allergic rhinitis, unspecified: Secondary | ICD-10-CM | POA: Diagnosis not present

## 2022-01-05 DIAGNOSIS — R051 Acute cough: Secondary | ICD-10-CM | POA: Diagnosis not present

## 2022-01-05 DIAGNOSIS — Z6829 Body mass index (BMI) 29.0-29.9, adult: Secondary | ICD-10-CM | POA: Diagnosis not present

## 2022-01-08 DIAGNOSIS — H6992 Unspecified Eustachian tube disorder, left ear: Secondary | ICD-10-CM | POA: Diagnosis not present

## 2022-01-08 DIAGNOSIS — R051 Acute cough: Secondary | ICD-10-CM | POA: Diagnosis not present

## 2022-02-08 DIAGNOSIS — F411 Generalized anxiety disorder: Secondary | ICD-10-CM | POA: Diagnosis not present

## 2022-02-08 DIAGNOSIS — F900 Attention-deficit hyperactivity disorder, predominantly inattentive type: Secondary | ICD-10-CM | POA: Diagnosis not present

## 2022-02-26 ENCOUNTER — Encounter: Payer: Self-pay | Admitting: Internal Medicine

## 2022-03-07 DIAGNOSIS — N94819 Vulvodynia, unspecified: Secondary | ICD-10-CM | POA: Diagnosis not present

## 2022-04-04 DIAGNOSIS — N898 Other specified noninflammatory disorders of vagina: Secondary | ICD-10-CM | POA: Diagnosis not present

## 2022-04-05 DIAGNOSIS — F411 Generalized anxiety disorder: Secondary | ICD-10-CM | POA: Diagnosis not present

## 2022-04-05 DIAGNOSIS — F900 Attention-deficit hyperactivity disorder, predominantly inattentive type: Secondary | ICD-10-CM | POA: Diagnosis not present

## 2022-05-10 DIAGNOSIS — F900 Attention-deficit hyperactivity disorder, predominantly inattentive type: Secondary | ICD-10-CM | POA: Diagnosis not present

## 2022-05-10 DIAGNOSIS — F411 Generalized anxiety disorder: Secondary | ICD-10-CM | POA: Diagnosis not present

## 2022-06-07 DIAGNOSIS — F900 Attention-deficit hyperactivity disorder, predominantly inattentive type: Secondary | ICD-10-CM | POA: Diagnosis not present

## 2022-06-07 DIAGNOSIS — F411 Generalized anxiety disorder: Secondary | ICD-10-CM | POA: Diagnosis not present

## 2022-07-12 DIAGNOSIS — F900 Attention-deficit hyperactivity disorder, predominantly inattentive type: Secondary | ICD-10-CM | POA: Diagnosis not present

## 2022-07-12 DIAGNOSIS — F411 Generalized anxiety disorder: Secondary | ICD-10-CM | POA: Diagnosis not present

## 2022-07-19 ENCOUNTER — Ambulatory Visit: Payer: BC Managed Care – PPO | Admitting: Internal Medicine

## 2022-07-25 DIAGNOSIS — L308 Other specified dermatitis: Secondary | ICD-10-CM | POA: Diagnosis not present

## 2022-07-25 DIAGNOSIS — L858 Other specified epidermal thickening: Secondary | ICD-10-CM | POA: Diagnosis not present

## 2022-08-16 DIAGNOSIS — F900 Attention-deficit hyperactivity disorder, predominantly inattentive type: Secondary | ICD-10-CM | POA: Diagnosis not present

## 2022-08-16 DIAGNOSIS — F411 Generalized anxiety disorder: Secondary | ICD-10-CM | POA: Diagnosis not present

## 2022-09-15 DIAGNOSIS — Z6829 Body mass index (BMI) 29.0-29.9, adult: Secondary | ICD-10-CM | POA: Diagnosis not present

## 2022-09-15 DIAGNOSIS — J039 Acute tonsillitis, unspecified: Secondary | ICD-10-CM | POA: Diagnosis not present

## 2022-09-15 DIAGNOSIS — Z789 Other specified health status: Secondary | ICD-10-CM | POA: Diagnosis not present

## 2022-09-15 DIAGNOSIS — J029 Acute pharyngitis, unspecified: Secondary | ICD-10-CM | POA: Diagnosis not present

## 2022-09-15 DIAGNOSIS — R051 Acute cough: Secondary | ICD-10-CM | POA: Diagnosis not present

## 2022-09-20 DIAGNOSIS — F900 Attention-deficit hyperactivity disorder, predominantly inattentive type: Secondary | ICD-10-CM | POA: Diagnosis not present

## 2022-09-20 DIAGNOSIS — F411 Generalized anxiety disorder: Secondary | ICD-10-CM | POA: Diagnosis not present

## 2022-10-18 DIAGNOSIS — F411 Generalized anxiety disorder: Secondary | ICD-10-CM | POA: Diagnosis not present

## 2022-10-18 DIAGNOSIS — F900 Attention-deficit hyperactivity disorder, predominantly inattentive type: Secondary | ICD-10-CM | POA: Diagnosis not present

## 2022-10-25 DIAGNOSIS — R102 Pelvic and perineal pain: Secondary | ICD-10-CM | POA: Diagnosis not present

## 2022-10-25 DIAGNOSIS — M545 Low back pain, unspecified: Secondary | ICD-10-CM | POA: Diagnosis not present

## 2022-10-28 DIAGNOSIS — N760 Acute vaginitis: Secondary | ICD-10-CM | POA: Diagnosis not present

## 2022-10-28 DIAGNOSIS — B9689 Other specified bacterial agents as the cause of diseases classified elsewhere: Secondary | ICD-10-CM | POA: Diagnosis not present

## 2022-11-01 DIAGNOSIS — M545 Low back pain, unspecified: Secondary | ICD-10-CM | POA: Diagnosis not present

## 2022-11-01 DIAGNOSIS — R102 Pelvic and perineal pain: Secondary | ICD-10-CM | POA: Diagnosis not present

## 2022-11-08 DIAGNOSIS — R102 Pelvic and perineal pain: Secondary | ICD-10-CM | POA: Diagnosis not present

## 2022-11-08 DIAGNOSIS — M545 Low back pain, unspecified: Secondary | ICD-10-CM | POA: Diagnosis not present

## 2022-11-11 NOTE — Progress Notes (Unsigned)
PCP:  Myrlene Broker, MD   No chief complaint on file.    HPI:      Ms. Carrie Kennedy is a 37 y.o. G0P0000 who LMP was No LMP recorded. (Menstrual status: IUD)., presents today for her annual examination.  Her menses are irregular on POPs, lasting 5-7 days, mod to light flow.  Dysmenorrhea mild, occurring first 1-2 days of flow. She has occas intermenstrual bleeding.   Sex activity: currently sexually active--contraception: POPs. 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 May want to conceive in near future.  Last Pap: 06/05/17  Results were: no abnormalities /neg HPV DNA  Hx of STDs: none  There is a FH of breast cancer in her MGM, genetic testing not indicated. There is a FH of ovarian cancer in her mat 2nd cousin. The patient does do self-breast exams.  Tobacco use: The patient denies current or previous tobacco use. Alcohol use: social drinker No drug use.  Exercise: moderately active  She does get adequate calcium and Vitamin D in her diet.  Pt on cymbalta for vulvodynia, followed by MD at [facility] Also has estrogen/lidocaine crm prn. Hx of BV in past, doing well with probiotics.   Also on propranolol for HTN, followed by PCP. Has anxiety/depression. Sees therapist, on cymbalta.  Past Medical History:  Diagnosis Date   Dysmenorrhea    Hyperlipidemia    Hypertension    Tachycardia    Vulvodynia     Past Surgical History:  Procedure Laterality Date   EAR      TUBES    Family History  Problem Relation Age of Onset   Hypertension Mother    Heart disease Mother    Prostate cancer Father    Hypertension Father    Breast cancer Maternal Grandmother 6    Social History   Socioeconomic History   Marital status: Married    Spouse name: Not on file   Number of children: Not on file   Years of education: Not on file   Highest education level: Not on file  Occupational History   Not on file  Tobacco Use   Smoking status: Never   Smokeless tobacco:  Never  Vaping Use   Vaping status: Never Used  Substance and Sexual Activity   Alcohol use: Yes   Drug use: No   Sexual activity: Yes    Birth control/protection: Pill  Other Topics Concern   Not on file  Social History Narrative   Not on file   Social Determinants of Health   Financial Resource Strain: Not on file  Food Insecurity: Not on file  Transportation Needs: Not on file  Physical Activity: Not on file  Stress: Not on file  Social Connections: Not on file  Intimate Partner Violence: Not on file    Outpatient Medications Prior to Visit  Medication Sig Dispense Refill   azelastine (ASTELIN) 0.1 % nasal spray Place into the nose.     diazepam (VALIUM) 5 MG tablet      DULoxetine (CYMBALTA) 60 MG capsule Take 60 mg by mouth daily.     fluticasone (FLONASE) 50 MCG/ACT nasal spray Place 2 sprays into both nostrils in the morning and at bedtime.     levonorgestrel (MIRENA) 20 MCG/DAY IUD 1 each by Intrauterine route once.     naltrexone (DEPADE) 50 MG tablet Take 1 tablet (50 mg total) by mouth daily. 30 tablet 6   Probiotic Product (PROBIOTIC DAILY) CAPS Take 1 capsule by  mouth daily.     propranolol (INDERAL) 20 MG tablet Take 20 mg by mouth 2 (two) times daily.     No facility-administered medications prior to visit.      ROS:  Review of Systems  Constitutional:  Negative for fatigue, fever and unexpected weight change.  Respiratory:  Negative for cough, shortness of breath and wheezing.   Cardiovascular:  Negative for chest pain, palpitations and leg swelling.  Gastrointestinal:  Negative for blood in stool, constipation, diarrhea, nausea and vomiting.  Endocrine: Negative for cold intolerance, heat intolerance and polyuria.  Genitourinary:  Positive for vaginal bleeding. Negative for dyspareunia, dysuria, flank pain, frequency, genital sores, hematuria, menstrual problem, pelvic pain, urgency, vaginal discharge and vaginal pain.  Musculoskeletal:  Negative for  back pain, joint swelling and myalgias.  Skin:  Negative for rash.  Neurological:  Negative for dizziness, syncope, light-headedness, numbness and headaches.  Hematological:  Negative for adenopathy.  Psychiatric/Behavioral:  Negative for agitation, confusion, dysphoric mood, sleep disturbance and suicidal ideas. The patient is not nervous/anxious.   BREAST: No symptoms   Objective: There were no vitals taken for this visit.   Physical Exam Constitutional:      Appearance: She is well-developed.  Genitourinary:     Vulva normal.     Right Labia: No rash, tenderness or lesions.    Left Labia: No tenderness, lesions or rash.    No vaginal discharge, erythema or tenderness.      Right Adnexa: not tender and no mass present.    Left Adnexa: not tender and no mass present.    No cervical motion tenderness, friability or polyp.     Uterus is not enlarged or tender.  Breasts:    Right: No mass, nipple discharge, skin change or tenderness.     Left: No mass, nipple discharge, skin change or tenderness.  Neck:     Thyroid: No thyromegaly.  Cardiovascular:     Rate and Rhythm: Normal rate and regular rhythm.     Heart sounds: Normal heart sounds. No murmur heard. Pulmonary:     Effort: Pulmonary effort is normal.     Breath sounds: Normal breath sounds.  Abdominal:     Palpations: Abdomen is soft.     Tenderness: There is no abdominal tenderness. There is no guarding or rebound.  Musculoskeletal:        General: Normal range of motion.     Cervical back: Normal range of motion.  Lymphadenopathy:     Cervical: No cervical adenopathy.  Neurological:     General: No focal deficit present.     Mental Status: She is alert and oriented to person, place, and time.     Cranial Nerves: No cranial nerve deficit.  Skin:    General: Skin is warm and dry.  Psychiatric:        Mood and Affect: Mood normal.        Behavior: Behavior normal.        Thought Content: Thought content normal.         Judgment: Judgment normal.  Vitals reviewed.     Assessment/Plan: Encounter for annual routine gynecological examination  Encounter for surveillance of contraceptive pills - Plan: Drospirenone (SLYND) 4 MG TABS; pt can only do prog only options due to HTN. Will try slynd due to BTB/irreg menses with camila. 2 samples/coupon card given. Will f/u with sx. If does well, will send in Rx. If not, will resume camila.   Breakthrough bleeding on OCPs -  Plan: Drospirenone (SLYND) 4 MG TABS  Vulvodynia--followed at [facility].   No orders of the defined types were placed in this encounter.    GYN counsel adequate intake of calcium and vitamin D, diet and exercise     F/U  No follow-ups on file.  Kwaku Mostafa B. Olando Willems, PA-C 11/11/2022 4:32 PM

## 2022-11-12 ENCOUNTER — Ambulatory Visit (INDEPENDENT_AMBULATORY_CARE_PROVIDER_SITE_OTHER): Payer: BC Managed Care – PPO | Admitting: Obstetrics and Gynecology

## 2022-11-12 ENCOUNTER — Other Ambulatory Visit (HOSPITAL_COMMUNITY)
Admission: RE | Admit: 2022-11-12 | Discharge: 2022-11-12 | Disposition: A | Discharge status: Home / Self Care | Payer: BC Managed Care – PPO | Source: Ambulatory Visit | Attending: Obstetrics and Gynecology | Admitting: Obstetrics and Gynecology

## 2022-11-12 ENCOUNTER — Encounter: Payer: Self-pay | Admitting: Obstetrics and Gynecology

## 2022-11-12 VITALS — BP 112/80 | HR 88 | Ht 63.0 in | Wt 172.0 lb

## 2022-11-12 DIAGNOSIS — Z01419 Encounter for gynecological examination (general) (routine) without abnormal findings: Secondary | ICD-10-CM | POA: Diagnosis not present

## 2022-11-12 DIAGNOSIS — Z1151 Encounter for screening for human papillomavirus (HPV): Secondary | ICD-10-CM | POA: Insufficient documentation

## 2022-11-12 DIAGNOSIS — Z124 Encounter for screening for malignant neoplasm of cervix: Secondary | ICD-10-CM | POA: Insufficient documentation

## 2022-11-12 DIAGNOSIS — N94819 Vulvodynia, unspecified: Secondary | ICD-10-CM

## 2022-11-12 DIAGNOSIS — B9689 Other specified bacterial agents as the cause of diseases classified elsewhere: Secondary | ICD-10-CM

## 2022-11-12 NOTE — Patient Instructions (Signed)
I value your feedback and you entrusting us with your care. If you get a [facility] patient survey, I would appreciate you taking the time to let us know about your experience today. Thank you! ? ? ?

## 2022-11-14 LAB — CYTOLOGY - PAP
Comment: NEGATIVE
Diagnosis: NEGATIVE
High risk HPV: NEGATIVE

## 2022-11-15 DIAGNOSIS — M545 Low back pain, unspecified: Secondary | ICD-10-CM | POA: Diagnosis not present

## 2022-11-15 DIAGNOSIS — R102 Pelvic and perineal pain: Secondary | ICD-10-CM | POA: Diagnosis not present

## 2022-11-15 DIAGNOSIS — F411 Generalized anxiety disorder: Secondary | ICD-10-CM | POA: Diagnosis not present

## 2022-11-15 DIAGNOSIS — F900 Attention-deficit hyperactivity disorder, predominantly inattentive type: Secondary | ICD-10-CM | POA: Diagnosis not present

## 2022-11-29 DIAGNOSIS — R102 Pelvic and perineal pain: Secondary | ICD-10-CM | POA: Diagnosis not present

## 2022-11-29 DIAGNOSIS — M545 Low back pain, unspecified: Secondary | ICD-10-CM | POA: Diagnosis not present

## 2022-12-13 DIAGNOSIS — F900 Attention-deficit hyperactivity disorder, predominantly inattentive type: Secondary | ICD-10-CM | POA: Diagnosis not present

## 2022-12-13 DIAGNOSIS — F411 Generalized anxiety disorder: Secondary | ICD-10-CM | POA: Diagnosis not present

## 2022-12-27 ENCOUNTER — Encounter: Payer: Self-pay | Admitting: Internal Medicine

## 2022-12-27 ENCOUNTER — Ambulatory Visit (INDEPENDENT_AMBULATORY_CARE_PROVIDER_SITE_OTHER): Payer: BC Managed Care – PPO | Admitting: Internal Medicine

## 2022-12-27 ENCOUNTER — Other Ambulatory Visit: Payer: BC Managed Care – PPO

## 2022-12-27 VITALS — BP 112/80 | HR 82 | Temp 98.3°F | Ht 63.0 in | Wt 173.0 lb

## 2022-12-27 DIAGNOSIS — Z0000 Encounter for general adult medical examination without abnormal findings: Secondary | ICD-10-CM | POA: Diagnosis not present

## 2022-12-27 DIAGNOSIS — F325 Major depressive disorder, single episode, in full remission: Secondary | ICD-10-CM

## 2022-12-27 DIAGNOSIS — J301 Allergic rhinitis due to pollen: Secondary | ICD-10-CM

## 2022-12-27 DIAGNOSIS — I10 Essential (primary) hypertension: Secondary | ICD-10-CM | POA: Diagnosis not present

## 2022-12-27 DIAGNOSIS — E559 Vitamin D deficiency, unspecified: Secondary | ICD-10-CM

## 2022-12-27 LAB — COMPREHENSIVE METABOLIC PANEL
ALT: 24 U/L (ref 0–35)
AST: 24 U/L (ref 0–37)
Albumin: 4.4 g/dL (ref 3.5–5.2)
Alkaline Phosphatase: 76 U/L (ref 39–117)
BUN: 12 mg/dL (ref 6–23)
CO2: 26 meq/L (ref 19–32)
Calcium: 9.2 mg/dL (ref 8.4–10.5)
Chloride: 108 meq/L (ref 96–112)
Creatinine, Ser: 0.69 mg/dL (ref 0.40–1.20)
GFR: 109.94 mL/min (ref >60.00)
Glucose, Bld: 93 mg/dL (ref 70–99)
Potassium: 4.3 meq/L (ref 3.5–5.1)
Sodium: 140 meq/L (ref 135–145)
Total Bilirubin: 0.3 mg/dL (ref 0.2–1.2)
Total Protein: 7.4 g/dL (ref 6.0–8.3)

## 2022-12-27 LAB — LIPID PANEL
Cholesterol: 197 mg/dL (ref 0–200)
HDL: 56.2 mg/dL (ref >39.00)
LDL Cholesterol: 124 mg/dL — ABNORMAL HIGH (ref 0–99)
NonHDL: 140.58
Total CHOL/HDL Ratio: 4
Triglycerides: 83 mg/dL (ref 0.0–149.0)
VLDL: 16.6 mg/dL (ref 0.0–40.0)

## 2022-12-27 LAB — CBC
HCT: 39.3 % (ref 36.0–46.0)
Hemoglobin: 12.8 g/dL (ref 12.0–15.0)
MCHC: 32.6 g/dL (ref 30.0–36.0)
MCV: 87 fL (ref 78.0–100.0)
Platelets: 382 10*3/uL (ref 150.0–400.0)
RBC: 4.53 Mil/uL (ref 3.87–5.11)
RDW: 14.1 % (ref 11.5–15.5)
WBC: 9.7 10*3/uL (ref 4.0–10.5)

## 2022-12-27 LAB — VITAMIN D 25 HYDROXY (VIT D DEFICIENCY, FRACTURES): VITD: 24.27 ng/mL — ABNORMAL LOW (ref 30.00–100.00)

## 2022-12-27 MED ORDER — AZELASTINE HCL 0.1 % NA SOLN: 2.0000 | Freq: Two times a day (BID) | NASAL | 11 refills | Status: DC

## 2022-12-27 MED ORDER — FLUTICASONE PROPIONATE 50 MCG/ACT NA SUSP: 2.0000 | Freq: Two times a day (BID) | NASAL | 3 refills | Status: DC

## 2022-12-27 NOTE — Assessment & Plan Note (Signed)
Controlled with propranolol 20 mg BID. Checking CMP and CBC.

## 2022-12-27 NOTE — Assessment & Plan Note (Signed)
Checking vitamin D level.

## 2022-12-27 NOTE — Assessment & Plan Note (Signed)
Controlled with counseling and cymbalta 60 mg daily.

## 2022-12-27 NOTE — Assessment & Plan Note (Signed)
Prescribed azelastine and flonase nasal spray and will continue. Adequately controlled.

## 2022-12-27 NOTE — Assessment & Plan Note (Signed)
Flu shot counseled. Tetanus up to date. Pap smear up to date. Counseled about sun safety and mole surveillance. Counseled about the dangers of distracted driving. Given 10 year screening recommendations.

## 2022-12-27 NOTE — Progress Notes (Signed)
   Subjective:   Patient ID: Carrie Kennedy, female    DOB: 08-01-1985, 37 y.o.   MRN: [id number]  HPI The patient is here for physical.  PMH, [facility], social history reviewed and updated  Review of Systems  Constitutional: Negative.   HENT: Negative.    Eyes: Negative.   Respiratory:  Negative for cough, chest tightness and shortness of breath.   Cardiovascular:  Negative for chest pain, palpitations and leg swelling.  Gastrointestinal:  Negative for abdominal distention, abdominal pain, constipation, diarrhea, nausea and vomiting.  Musculoskeletal: Negative.   Skin: Negative.   Neurological: Negative.   Psychiatric/Behavioral: Negative.      Objective:  Physical Exam Constitutional:      Appearance: She is well-developed.  HENT:     Head: Normocephalic and atraumatic.  Cardiovascular:     Rate and Rhythm: Normal rate and regular rhythm.  Pulmonary:     Effort: Pulmonary effort is normal. No respiratory distress.     Breath sounds: Normal breath sounds. No wheezing or rales.  Abdominal:     General: Bowel sounds are normal. There is no distension.     Palpations: Abdomen is soft.     Tenderness: There is no abdominal tenderness. There is no rebound.  Musculoskeletal:     Cervical back: Normal range of motion.  Skin:    General: Skin is warm and dry.  Neurological:     Mental Status: She is alert and oriented to person, place, and time.     Coordination: Coordination normal.     Vitals:   12/27/22 0917  BP: 112/80  Pulse: 82  Temp: 98.3 F (36.8 C)  TempSrc: Oral  SpO2: 98%  Weight: 173 lb (78.5 kg)  Height: 5\' 3"  (1.6 m)    Assessment & Plan:

## 2023-01-10 DIAGNOSIS — F411 Generalized anxiety disorder: Secondary | ICD-10-CM | POA: Diagnosis not present

## 2023-01-10 DIAGNOSIS — F900 Attention-deficit hyperactivity disorder, predominantly inattentive type: Secondary | ICD-10-CM | POA: Diagnosis not present

## 2023-02-07 DIAGNOSIS — L918 Other hypertrophic disorders of the skin: Secondary | ICD-10-CM | POA: Diagnosis not present

## 2023-02-07 DIAGNOSIS — R21 Rash and other nonspecific skin eruption: Secondary | ICD-10-CM | POA: Diagnosis not present

## 2023-02-07 DIAGNOSIS — F411 Generalized anxiety disorder: Secondary | ICD-10-CM | POA: Diagnosis not present

## 2023-02-07 DIAGNOSIS — D1809 Hemangioma of other sites: Secondary | ICD-10-CM | POA: Diagnosis not present

## 2023-02-07 DIAGNOSIS — D1801 Hemangioma of skin and subcutaneous tissue: Secondary | ICD-10-CM | POA: Diagnosis not present

## 2023-02-07 DIAGNOSIS — F900 Attention-deficit hyperactivity disorder, predominantly inattentive type: Secondary | ICD-10-CM | POA: Diagnosis not present

## 2023-03-07 DIAGNOSIS — Z1152 Encounter for screening for COVID-19: Secondary | ICD-10-CM | POA: Diagnosis not present

## 2023-03-07 DIAGNOSIS — J111 Influenza due to unidentified influenza virus with other respiratory manifestations: Secondary | ICD-10-CM | POA: Diagnosis not present

## 2023-03-07 DIAGNOSIS — M791 Myalgia, unspecified site: Secondary | ICD-10-CM | POA: Diagnosis not present

## 2023-03-07 DIAGNOSIS — R051 Acute cough: Secondary | ICD-10-CM | POA: Diagnosis not present

## 2023-03-14 DIAGNOSIS — F411 Generalized anxiety disorder: Secondary | ICD-10-CM | POA: Diagnosis not present

## 2023-03-14 DIAGNOSIS — F900 Attention-deficit hyperactivity disorder, predominantly inattentive type: Secondary | ICD-10-CM | POA: Diagnosis not present

## 2023-03-28 DIAGNOSIS — N94819 Vulvodynia, unspecified: Secondary | ICD-10-CM | POA: Diagnosis not present

## 2023-03-28 DIAGNOSIS — F419 Anxiety disorder, unspecified: Secondary | ICD-10-CM | POA: Diagnosis not present

## 2023-04-11 DIAGNOSIS — F900 Attention-deficit hyperactivity disorder, predominantly inattentive type: Secondary | ICD-10-CM | POA: Diagnosis not present

## 2023-04-11 DIAGNOSIS — F411 Generalized anxiety disorder: Secondary | ICD-10-CM | POA: Diagnosis not present

## 2023-05-09 DIAGNOSIS — D225 Melanocytic nevi of trunk: Secondary | ICD-10-CM | POA: Diagnosis not present

## 2023-05-09 DIAGNOSIS — D2261 Melanocytic nevi of right upper limb, including shoulder: Secondary | ICD-10-CM | POA: Diagnosis not present

## 2023-05-09 DIAGNOSIS — L858 Other specified epidermal thickening: Secondary | ICD-10-CM | POA: Diagnosis not present

## 2023-05-09 DIAGNOSIS — L814 Other melanin hyperpigmentation: Secondary | ICD-10-CM | POA: Diagnosis not present

## 2023-05-16 DIAGNOSIS — F411 Generalized anxiety disorder: Secondary | ICD-10-CM | POA: Diagnosis not present

## 2023-05-16 DIAGNOSIS — F900 Attention-deficit hyperactivity disorder, predominantly inattentive type: Secondary | ICD-10-CM | POA: Diagnosis not present

## 2023-05-26 DIAGNOSIS — Z713 Dietary counseling and surveillance: Secondary | ICD-10-CM | POA: Diagnosis not present

## 2023-05-26 DIAGNOSIS — Z8249 Family history of ischemic heart disease and other diseases of the circulatory system: Secondary | ICD-10-CM | POA: Diagnosis not present

## 2023-06-06 DIAGNOSIS — Z713 Dietary counseling and surveillance: Secondary | ICD-10-CM | POA: Diagnosis not present

## 2023-06-06 DIAGNOSIS — Z8249 Family history of ischemic heart disease and other diseases of the circulatory system: Secondary | ICD-10-CM | POA: Diagnosis not present

## 2023-06-20 DIAGNOSIS — Z713 Dietary counseling and surveillance: Secondary | ICD-10-CM | POA: Diagnosis not present

## 2023-06-20 DIAGNOSIS — Z8249 Family history of ischemic heart disease and other diseases of the circulatory system: Secondary | ICD-10-CM | POA: Diagnosis not present

## 2023-06-27 DIAGNOSIS — F411 Generalized anxiety disorder: Secondary | ICD-10-CM | POA: Diagnosis not present

## 2023-06-27 DIAGNOSIS — F900 Attention-deficit hyperactivity disorder, predominantly inattentive type: Secondary | ICD-10-CM | POA: Diagnosis not present

## 2023-07-11 DIAGNOSIS — Z8249 Family history of ischemic heart disease and other diseases of the circulatory system: Secondary | ICD-10-CM | POA: Diagnosis not present

## 2023-07-11 DIAGNOSIS — Z713 Dietary counseling and surveillance: Secondary | ICD-10-CM | POA: Diagnosis not present

## 2023-07-25 DIAGNOSIS — F411 Generalized anxiety disorder: Secondary | ICD-10-CM | POA: Diagnosis not present

## 2023-07-25 DIAGNOSIS — Z713 Dietary counseling and surveillance: Secondary | ICD-10-CM | POA: Diagnosis not present

## 2023-07-25 DIAGNOSIS — F900 Attention-deficit hyperactivity disorder, predominantly inattentive type: Secondary | ICD-10-CM | POA: Diagnosis not present

## 2023-07-25 DIAGNOSIS — Z8249 Family history of ischemic heart disease and other diseases of the circulatory system: Secondary | ICD-10-CM | POA: Diagnosis not present

## 2023-08-15 DIAGNOSIS — Z8249 Family history of ischemic heart disease and other diseases of the circulatory system: Secondary | ICD-10-CM | POA: Diagnosis not present

## 2023-08-15 DIAGNOSIS — Z713 Dietary counseling and surveillance: Secondary | ICD-10-CM | POA: Diagnosis not present

## 2023-08-22 DIAGNOSIS — F411 Generalized anxiety disorder: Secondary | ICD-10-CM | POA: Diagnosis not present

## 2023-08-22 DIAGNOSIS — F900 Attention-deficit hyperactivity disorder, predominantly inattentive type: Secondary | ICD-10-CM | POA: Diagnosis not present

## 2023-08-29 DIAGNOSIS — Z8249 Family history of ischemic heart disease and other diseases of the circulatory system: Secondary | ICD-10-CM | POA: Diagnosis not present

## 2023-08-29 DIAGNOSIS — Z713 Dietary counseling and surveillance: Secondary | ICD-10-CM | POA: Diagnosis not present

## 2023-09-19 DIAGNOSIS — F900 Attention-deficit hyperactivity disorder, predominantly inattentive type: Secondary | ICD-10-CM | POA: Diagnosis not present

## 2023-09-19 DIAGNOSIS — F411 Generalized anxiety disorder: Secondary | ICD-10-CM | POA: Diagnosis not present

## 2023-10-24 DIAGNOSIS — F900 Attention-deficit hyperactivity disorder, predominantly inattentive type: Secondary | ICD-10-CM | POA: Diagnosis not present

## 2023-10-24 DIAGNOSIS — F411 Generalized anxiety disorder: Secondary | ICD-10-CM | POA: Diagnosis not present

## 2023-11-28 DIAGNOSIS — F411 Generalized anxiety disorder: Secondary | ICD-10-CM | POA: Diagnosis not present

## 2023-11-28 DIAGNOSIS — F900 Attention-deficit hyperactivity disorder, predominantly inattentive type: Secondary | ICD-10-CM | POA: Diagnosis not present

## 2023-12-26 ENCOUNTER — Encounter: Payer: Self-pay | Admitting: Obstetrics and Gynecology

## 2023-12-26 ENCOUNTER — Ambulatory Visit (INDEPENDENT_AMBULATORY_CARE_PROVIDER_SITE_OTHER): Admitting: Obstetrics and Gynecology

## 2023-12-26 VITALS — BP 100/70 | HR 83 | Ht 63.0 in | Wt 168.0 lb

## 2023-12-26 DIAGNOSIS — Z01411 Encounter for gynecological examination (general) (routine) with abnormal findings: Secondary | ICD-10-CM

## 2023-12-26 DIAGNOSIS — F900 Attention-deficit hyperactivity disorder, predominantly inattentive type: Secondary | ICD-10-CM | POA: Diagnosis not present

## 2023-12-26 DIAGNOSIS — N94819 Vulvodynia, unspecified: Secondary | ICD-10-CM

## 2023-12-26 DIAGNOSIS — F411 Generalized anxiety disorder: Secondary | ICD-10-CM | POA: Diagnosis not present

## 2023-12-26 DIAGNOSIS — Z01419 Encounter for gynecological examination (general) (routine) without abnormal findings: Secondary | ICD-10-CM

## 2023-12-26 NOTE — Patient Instructions (Signed)
 I value your feedback and you entrusting [place] with your care. If you get a [facility] patient survey, I would appreciate you taking the time to let us know about your experience today. Thank you! ? ? ?

## 2023-12-26 NOTE — Progress Notes (Signed)
 PCP:  Rollene Almarie LABOR, MD   Chief Complaint  Patient presents with   Gynecologic Exam    No concerns     HPI:      Ms. Carrie Kennedy is a 38 y.o. G0P0000 who LMP was Patient's last menstrual period was 12/15/2023., presents today for her annual examination.  Her menses are monthly off POPs, lasting 5-6 days, mod to light flow.  Dysmenorrhea mild, occurring first 1-2 days of flow. No BTB.   Sex activity: currently sexually active--contraception: none, conception ok.  Hx of vulvodynia but sex is infrequent due to pain; on meds with sx control if not sexually active.   Last Pap: 11/12/22 Results were: no abnormalities /neg HPV DNA  Hx of STDs: none  Pt has had issues with recurrent UTI and BV, was seeing [facility]. Uses boric acid supp and probiotics with sx improvement prn.   There is a FH of breast cancer in her MGM, genetic testing not indicated. There is a FH of ovarian cancer in her mat 2nd cousin. The patient does do self-breast exams.  Tobacco use: The patient denies current or previous tobacco use. Alcohol use: rare No drug use.  Exercise: moderately active  She does get adequate calcium aut not Vitamin D  in her diet.  Pt on cymbalta  for vulvodynia, followed by MD at [facility]. Also has estrogen/lidocaine crm prn.    Also on propranolol  for HTN, followed by PCP. Has anxiety/depression. Sees therapist, on cymbalta .  Past Medical History:  Diagnosis Date   Allergy    Anxiety    Depression    Dysmenorrhea    GERD (gastroesophageal reflux disease)    On occasion   Hyperlipidemia    Hypertension    Neuromuscular disorder (HCC)    Tachycardia    Vulvodynia     Past Surgical History:  Procedure Laterality Date   EAR      TUBES    Family History  Problem Relation Age of Onset   Hypertension Mother    Heart disease Mother    Depression Mother    Hearing loss Mother    Hyperlipidemia Mother    Prostate cancer Father    Hypertension Father    Asthma Father     Hyperlipidemia Father    Kidney disease Sister    Tongue cancer Maternal Aunt    Breast cancer Maternal Grandmother 62    Social History   Socioeconomic History   Marital status: Married    Spouse name: Not on file   Number of children: Not on file   Years of education: Not on file   Highest education level: Associate degree: academic program  Occupational History   Not on file  Tobacco Use   Smoking status: Never   Smokeless tobacco: Never  Vaping Use   Vaping status: Never Used  Substance and Sexual Activity   Alcohol use: Yes    Alcohol/week: 1.0 standard drink of alcohol    Types: 1 Glasses of wine per week   Drug use: No   Sexual activity: Yes    Birth control/protection: None  Other Topics Concern   Not on file  Social History Narrative   Not on file   Social Drivers of Health   Financial Resource Strain: Low Risk  (12/20/2022)   Overall Financial Resource Strain (CARDIA)    Difficulty of Paying Living Expenses: Not hard at all  Food Insecurity: No Food Insecurity (12/20/2022)   Hunger Vital Sign    Worried About  Running Out of Food in the Last Year: Never true    Ran Out of Food in the Last Year: Never true  Transportation Needs: No Transportation Needs (12/20/2022)   PRAPARE - Administrator, Civil Service (Medical): No    Lack of Transportation (Non-Medical): No  Physical Activity: Sufficiently Active (12/20/2022)   Exercise Vital Sign    Days of Exercise per Week: 4 days    Minutes of Exercise per Session: 140 min  Stress: No Stress Concern Present (12/20/2022)   [employer] of Occupational Health - Occupational Stress Questionnaire    Feeling of Stress : Only a little  Social Connections: Moderately Integrated (12/20/2022)   Social Connection and Isolation Panel    Frequency of Communication with Friends and Family: More than three times a week    Frequency of Social Gatherings with Friends and Family: Twice a week    Attends  Religious Services: More than 4 times per year    Active Member of [employer] or Organizations: No    Attends Engineer, Structural: Not on file    Marital Status: Married  Catering Manager Violence: Not on file    Outpatient Medications Prior to Visit  Medication Sig Dispense Refill   azelastine  (ASTELIN ) 0.1 % nasal spray Place 2 sprays into both nostrils 2 (two) times daily. 30 mL 11   busPIRone (BUSPAR) 5 MG tablet Take 5 mg by mouth 2 (two) times daily.     diazepam (VALIUM) 5 MG tablet      DULoxetine  (CYMBALTA ) 60 MG capsule Take 60 mg by mouth daily.     fluticasone  (FLONASE ) 50 MCG/ACT nasal spray Place 2 sprays into both nostrils in the morning and at bedtime. 48 g 3   gabapentin (NEURONTIN) 300 MG capsule Take by mouth.     NON FORMULARY Estradiol 0.5 mg/g+5% lidocaine compounded in hydrophilic petrolatum. Apply pea sized amt to affected area twice daily. Dispense one 60g tube. Refills:2     Probiotic Product (PROBIOTIC DAILY) CAPS Take 1 capsule by mouth daily.     propranolol  (INDERAL ) 20 MG tablet Take 20 mg by mouth 2 (two) times daily.     Turmeric (QC TUMERIC COMPLEX) 500 MG CAPS      No facility-administered medications prior to visit.      ROS:  Review of Systems  Constitutional:  Negative for fatigue, fever and unexpected weight change.  Respiratory:  Negative for cough, shortness of breath and wheezing.   Cardiovascular:  Negative for chest pain, palpitations and leg swelling.  Gastrointestinal:  Negative for blood in stool, constipation, diarrhea, nausea and vomiting.  Endocrine: Negative for cold intolerance, heat intolerance and polyuria.  Genitourinary:  Negative for dyspareunia, dysuria, flank pain, frequency, genital sores, hematuria, menstrual problem, pelvic pain, urgency, vaginal bleeding, vaginal discharge and vaginal pain.  Musculoskeletal:  Negative for back pain, joint swelling and myalgias.  Skin:  Negative for rash.  Neurological:  Negative  for dizziness, syncope, light-headedness, numbness and headaches.  Hematological:  Negative for adenopathy.  Psychiatric/Behavioral:  Positive for agitation. Negative for confusion, dysphoric mood, sleep disturbance and suicidal ideas. The patient is not nervous/anxious.   BREAST: No symptoms   Objective: BP 100/70   Pulse 83   Ht 5' 3 (1.6 m)   Wt 168 lb (76.2 kg)   LMP 12/15/2023   BMI 29.76 kg/m    Physical Exam Constitutional:      Appearance: She is well-developed.  Genitourinary:     Vulva  normal.     Right Labia: No rash, tenderness or lesions.    Left Labia: No tenderness, lesions or rash.    No vaginal discharge, erythema or tenderness.      Right Adnexa: not tender and no mass present.    Left Adnexa: not tender and no mass present.    No cervical motion tenderness, friability or polyp.     Uterus is not enlarged or tender.  Breasts:    Right: No mass, nipple discharge, skin change or tenderness.     Left: No mass, nipple discharge, skin change or tenderness.  Neck:     Thyroid : No thyromegaly.  Cardiovascular:     Rate and Rhythm: Normal rate and regular rhythm.     Heart sounds: Normal heart sounds. No murmur heard. Pulmonary:     Effort: Pulmonary effort is normal.     Breath sounds: Normal breath sounds.  Abdominal:     Palpations: Abdomen is soft.     Tenderness: There is no abdominal tenderness. There is no guarding or rebound.  Musculoskeletal:        General: Normal range of motion.     Cervical back: Normal range of motion.  Lymphadenopathy:     Cervical: No cervical adenopathy.  Neurological:     General: No focal deficit present.     Mental Status: She is alert and oriented to person, place, and time.     Cranial Nerves: No cranial nerve deficit.  Skin:    General: Skin is warm and dry.  Psychiatric:        Mood and Affect: Mood normal.        Behavior: Behavior normal.        Thought Content: Thought content normal.        Judgment:  Judgment normal.  Vitals reviewed.     Assessment/Plan: Encounter for annual routine gynecological examination  Vulvodynia--followed at [facility]   GYN counsel adequate intake of calcium and vitamin D , diet and exercise     F/U  Return in about 1 year (around 12/25/2024).  Ellyanna Holton B. Melat Wrisley, PA-C 12/26/2023 4:44 PM

## 2024-01-08 ENCOUNTER — Other Ambulatory Visit: Payer: Self-pay | Admitting: Internal Medicine

## 2024-01-09 ENCOUNTER — Encounter: Admitting: Internal Medicine

## 2024-01-23 DIAGNOSIS — F411 Generalized anxiety disorder: Secondary | ICD-10-CM | POA: Diagnosis not present

## 2024-01-23 DIAGNOSIS — F900 Attention-deficit hyperactivity disorder, predominantly inattentive type: Secondary | ICD-10-CM | POA: Diagnosis not present

## 2024-02-06 ENCOUNTER — Encounter: Admitting: Internal Medicine

## 2024-02-20 ENCOUNTER — Other Ambulatory Visit: Payer: Self-pay | Admitting: Medical Genetics

## 2024-02-27 ENCOUNTER — Encounter: Admitting: Internal Medicine

## 2024-03-12 ENCOUNTER — Encounter: Payer: Self-pay | Admitting: Internal Medicine

## 2024-03-12 ENCOUNTER — Ambulatory Visit: Admitting: Internal Medicine

## 2024-03-12 VITALS — BP 110/70 | HR 81 | Temp 98.1°F | Ht 63.0 in | Wt 169.8 lb

## 2024-03-12 DIAGNOSIS — F325 Major depressive disorder, single episode, in full remission: Secondary | ICD-10-CM

## 2024-03-12 DIAGNOSIS — Z0000 Encounter for general adult medical examination without abnormal findings: Secondary | ICD-10-CM

## 2024-03-12 DIAGNOSIS — Z8349 Family history of other endocrine, nutritional and metabolic diseases: Secondary | ICD-10-CM

## 2024-03-12 DIAGNOSIS — I10 Essential (primary) hypertension: Secondary | ICD-10-CM

## 2024-03-12 DIAGNOSIS — E559 Vitamin D deficiency, unspecified: Secondary | ICD-10-CM

## 2024-03-12 LAB — COMPREHENSIVE METABOLIC PANEL WITH GFR
ALT: 35 U/L (ref 3–35)
AST: 31 U/L (ref 5–37)
Albumin: 4 g/dL (ref 3.5–5.2)
Alkaline Phosphatase: 67 U/L (ref 39–117)
BUN: 8 mg/dL (ref 6–23)
CO2: 31 meq/L (ref 19–32)
Calcium: 9.3 mg/dL (ref 8.4–10.5)
Chloride: 105 meq/L (ref 96–112)
Creatinine, Ser: 0.69 mg/dL (ref 0.40–1.20)
GFR: 109.02 mL/min
Glucose, Bld: 80 mg/dL (ref 70–99)
Potassium: 4.2 meq/L (ref 3.5–5.1)
Sodium: 140 meq/L (ref 135–145)
Total Bilirubin: 0.3 mg/dL (ref 0.2–1.2)
Total Protein: 7.1 g/dL (ref 6.0–8.3)

## 2024-03-12 LAB — LIPID PANEL
Cholesterol: 126 mg/dL (ref 28–200)
HDL: 36.1 mg/dL — ABNORMAL LOW
LDL Cholesterol: 75 mg/dL (ref 10–99)
NonHDL: 90
Total CHOL/HDL Ratio: 3
Triglycerides: 73 mg/dL (ref 10.0–149.0)
VLDL: 14.6 mg/dL (ref 0.0–40.0)

## 2024-03-12 LAB — CBC
HCT: 39.7 % (ref 36.0–46.0)
Hemoglobin: 13.3 g/dL (ref 12.0–15.0)
MCHC: 33.5 g/dL (ref 30.0–36.0)
MCV: 84.1 fl (ref 78.0–100.0)
Platelets: 343 10*3/uL (ref 150.0–400.0)
RBC: 4.72 Mil/uL (ref 3.87–5.11)
RDW: 13.9 % (ref 11.5–15.5)
WBC: 4.6 10*3/uL (ref 4.0–10.5)

## 2024-03-12 LAB — VITAMIN D 25 HYDROXY (VIT D DEFICIENCY, FRACTURES): VITD: 22.9 ng/mL — ABNORMAL LOW (ref 30.00–100.00)

## 2024-03-12 LAB — TSH: TSH: 2.29 u[IU]/mL (ref 0.35–5.50)

## 2024-03-12 MED ORDER — AZELASTINE HCL 137 MCG/SPRAY NA SOLN: 2.0000 | Freq: Two times a day (BID) | NASAL | 11 refills | Status: AC

## 2024-03-12 MED ORDER — FLUTICASONE PROPIONATE 50 MCG/ACT NA SUSP: 2.0000 | Freq: Every day | NASAL | 3 refills | Status: AC

## 2024-03-12 NOTE — Assessment & Plan Note (Signed)
 Managed through mental health and stable on duloxetine .

## 2024-03-12 NOTE — Progress Notes (Signed)
" ° °  Subjective:   Patient ID: Carrie Kennedy, female    DOB: Sep 18, 1985, 39 y.o.   MRN: [id number]  The patient is here for physical. Pertinent topics discussed: Discussed the use of AI scribe software for clinical note transcription with the patient, who gave verbal consent to proceed.  History of Present Illness Carrie Kennedy is a 39 year old female who presents with symptoms of a viral illness and concerns about photosensitivity.  She is experiencing a red, pruritic rash on her arms when exposed to the sun, which started in the last two summers. She has tried using sun protection but finds it uncomfortable. She is concerned about the possibility of drug-induced photosensitivity, as she has not changed any medications or soaps recently. She has a history of using tanning beds in her youth but is now avoiding them due to skin cancer risks.  She continues to exercise regularly and is seeing a nutritionist through her gym. She is also managing pelvic pain with specialists and is in therapy for related issues. She is dealing with personal stressors, including her husband's job loss and home renovations, which she is primarily managing.  PMH, [facility], social history reviewed and updated  Review of Systems  Constitutional: Negative.   HENT: Negative.    Eyes: Negative.   Respiratory:  Negative for cough, chest tightness and shortness of breath.   Cardiovascular:  Negative for chest pain, palpitations and leg swelling.  Gastrointestinal:  Negative for abdominal distention, abdominal pain, constipation, diarrhea, nausea and vomiting.  Musculoskeletal: Negative.   Skin: Negative.   Neurological: Negative.   Psychiatric/Behavioral: Negative.      Objective:  Physical Exam Constitutional:      Appearance: She is well-developed.  HENT:     Head: Normocephalic and atraumatic.  Cardiovascular:     Rate and Rhythm: Normal rate and regular rhythm.  Pulmonary:     Effort: Pulmonary effort is  normal. No respiratory distress.     Breath sounds: Normal breath sounds. No wheezing or rales.  Abdominal:     General: Bowel sounds are normal. There is no distension.     Palpations: Abdomen is soft.     Tenderness: There is no abdominal tenderness.  Musculoskeletal:     Cervical back: Normal range of motion.  Skin:    General: Skin is warm and dry.  Neurological:     Mental Status: She is alert and oriented to person, place, and time.     Coordination: Coordination normal.     Vitals:   03/12/24 1006  BP: 110/70  Pulse: 81  Temp: 98.1 F (36.7 C)  TempSrc: Oral  SpO2: 98%  Weight: 169 lb 12.8 oz (77 kg)  Height: 5' 3 (1.6 m)    Assessment & Plan:   "

## 2024-03-12 NOTE — Assessment & Plan Note (Signed)
 Checking TSH.

## 2024-03-12 NOTE — Assessment & Plan Note (Signed)
 Checking vitamin d  and adjust as needed.

## 2024-03-12 NOTE — Assessment & Plan Note (Signed)
 Flu shot up to date. Tetanus up to date, pap smear up to date with gyn. Counseled about sun safety and mole surveillance. Counseled about the dangers of distracted driving. Given 10 year screening recommendations.

## 2024-03-12 NOTE — Assessment & Plan Note (Signed)
 BP at goal on propranolol . Checking CMP and CBC and lipid panel.

## 2024-04-01 ENCOUNTER — Other Ambulatory Visit
